# Patient Record
Sex: Female | Born: 1960 | Race: White | Hispanic: No | Marital: Married | Smoking: Never smoker
Health system: Southern US, Community
[De-identification: ages and names within clinical notes are randomized; demographics above are authoritative.]

## PROBLEM LIST (undated history)

## (undated) DIAGNOSIS — C801 Malignant (primary) neoplasm, unspecified: Secondary | ICD-10-CM

## (undated) HISTORY — PX: APPENDECTOMY: SHX54

## (undated) HISTORY — DX: Malignant (primary) neoplasm, unspecified: C80.1

---

## 1997-10-13 ENCOUNTER — Other Ambulatory Visit: Admission: RE | Admit: 1997-10-13 | Discharge: 1997-10-13 | Payer: Self-pay | Admitting: Obstetrics and Gynecology

## 1997-12-28 ENCOUNTER — Other Ambulatory Visit: Admission: RE | Admit: 1997-12-28 | Discharge: 1997-12-28 | Payer: Self-pay | Admitting: Obstetrics and Gynecology

## 1998-11-11 ENCOUNTER — Other Ambulatory Visit: Admission: RE | Admit: 1998-11-11 | Discharge: 1998-11-11 | Payer: Self-pay | Admitting: Obstetrics and Gynecology

## 2000-01-19 ENCOUNTER — Other Ambulatory Visit: Admission: RE | Admit: 2000-01-19 | Discharge: 2000-01-19 | Payer: Self-pay | Admitting: Obstetrics and Gynecology

## 2001-02-17 ENCOUNTER — Other Ambulatory Visit: Admission: RE | Admit: 2001-02-17 | Discharge: 2001-02-17 | Payer: Self-pay | Admitting: Obstetrics and Gynecology

## 2002-03-20 ENCOUNTER — Other Ambulatory Visit: Admission: RE | Admit: 2002-03-20 | Discharge: 2002-03-20 | Payer: Self-pay | Admitting: Obstetrics and Gynecology

## 2003-05-25 ENCOUNTER — Other Ambulatory Visit: Admission: RE | Admit: 2003-05-25 | Discharge: 2003-05-25 | Payer: Self-pay | Admitting: Obstetrics and Gynecology

## 2004-06-07 ENCOUNTER — Other Ambulatory Visit: Admission: RE | Admit: 2004-06-07 | Discharge: 2004-06-07 | Payer: Self-pay | Admitting: Obstetrics and Gynecology

## 2007-03-21 ENCOUNTER — Encounter: Admission: RE | Admit: 2007-03-21 | Discharge: 2007-03-21 | Payer: Self-pay | Admitting: Obstetrics and Gynecology

## 2010-01-05 ENCOUNTER — Encounter (INDEPENDENT_AMBULATORY_CARE_PROVIDER_SITE_OTHER): Payer: Self-pay | Admitting: *Deleted

## 2010-01-19 ENCOUNTER — Encounter (INDEPENDENT_AMBULATORY_CARE_PROVIDER_SITE_OTHER): Payer: Self-pay | Admitting: *Deleted

## 2010-01-20 ENCOUNTER — Ambulatory Visit: Payer: Self-pay | Admitting: Internal Medicine

## 2010-02-16 ENCOUNTER — Telehealth: Payer: Self-pay | Admitting: Internal Medicine

## 2010-02-17 ENCOUNTER — Encounter: Payer: Self-pay | Admitting: Internal Medicine

## 2010-02-17 ENCOUNTER — Ambulatory Visit
Admission: RE | Admit: 2010-02-17 | Discharge: 2010-02-17 | Payer: Self-pay | Source: Home / Self Care | Attending: Internal Medicine | Admitting: Internal Medicine

## 2010-02-26 ENCOUNTER — Encounter: Payer: Self-pay | Admitting: Obstetrics and Gynecology

## 2010-03-07 NOTE — Letter (Signed)
Summary: Pre Visit Letter Revised  [facility]  [address] [place], [place] [address]   Phone: [phone]  Fax: [phone]        01/05/2010 MRN: [id number] [facility] [address] [place], [place]  [address]             Procedure Date:  02/09/2010   Welcome to the Gastroenterology Division at [facility].    You are scheduled to see a nurse for your pre-procedure visit on 01/20/2010 at 4:00 on the 3rd floor at [facility], [address]  We ask that you try to arrive at our office 15 minutes prior to your appointment time to allow for check-in.  Please take a minute to review the attached form.  If you answer "Yes" to one or more of the questions on the first page, we ask that you call the person listed at your earliest opportunity.  If you answer "No" to all of the questions, please complete the rest of the form and bring it to your appointment.    Your nurse visit will consist of discussing your medical and surgical history, your immediate family medical history, and your medications.   If you are unable to list all of your medications on the form, please bring the medication bottles to your appointment and we will list them.  We will need to be aware of both prescribed and over the counter drugs.  We will need to know exact dosage information as well.    Please be prepared to read and sign documents such as consent forms, a financial agreement, and acknowledgement forms.  If necessary, and with your consent, a friend or relative is welcome to sit-in on the nurse visit with you.  Please bring your insurance card so that we may make a copy of it.  If your insurance requires a referral to see a specialist, please bring your referral form from your primary care physician.  No co-pay is required for this nurse visit.     If you cannot keep your appointment, please call [phone] to cancel or reschedule prior to your appointment date.   This allows [place] the opportunity to schedule an appointment for another patient in need of care.    Thank you for choosing [facility] for your medical needs.  We appreciate the opportunity to care for you.  Please visit [place] at our website  to learn more about our practice.  Sincerely, The Gastroenterology Division

## 2010-03-09 NOTE — Letter (Signed)
Summary: Moviprep Instructions  [facility]  [address]   [place], [place] [address]   Phone: [phone]  Fax: [phone]       April Gutierrez    1960-10-22    MRN: [id number]        Procedure Day Dorna Bloom: Thursday, 02-09-10     Arrival Time: 2:00 p.m.     Procedure Time: 3:00 p.m.     Location of Procedure:                    x   [facility] (4th Floor)   PREPARATION FOR COLONOSCOPY WITH MOVIPREP   Starting 5 days prior to your procedure 02-04-10 do not eat nuts, seeds, popcorn, corn, beans, peas,  salads, or any raw vegetables.  Do not take any fiber supplements (e.g. Metamucil, Citrucel, and Benefiber).  THE DAY BEFORE YOUR PROCEDURE         DATE: 02-08-10   DAY: Wednesday  1.  Drink clear liquids the entire day-NO SOLID FOOD  2.  Do not drink anything colored red or purple.  Avoid juices with pulp.  No orange juice.  3.  Drink at least 64 oz. (8 glasses) of fluid/clear liquids during the day to prevent dehydration and help the prep work efficiently.  CLEAR LIQUIDS INCLUDE: Water Jello Ice Popsicles Tea (sugar ok, no milk/cream) Powdered fruit flavored drinks Coffee (sugar ok, no milk/cream) Gatorade Juice: apple, white grape, white cranberry  Lemonade Clear bullion, consomm, broth Carbonated beverages (any kind) Strained chicken noodle soup Hard Candy                             4.  In the morning, mix first dose of MoviPrep solution:    Empty 1 Pouch A and 1 Pouch B into the disposable container    Add lukewarm drinking water to the top line of the container. Mix to dissolve    Refrigerate (mixed solution should be used within 24 hrs)  5.  Begin drinking the prep at 5:00 p.m. The MoviPrep container is divided by 4 marks.   Every 15 minutes drink the solution down to the next mark (approximately 8 oz) until the full liter is complete.   6.  Follow completed prep with 16 oz of clear liquid of your choice (Nothing red or  purple).  Continue to drink clear liquids until bedtime.  7.  Before going to bed, mix second dose of MoviPrep solution:    Empty 1 Pouch A and 1 Pouch B into the disposable container    Add lukewarm drinking water to the top line of the container. Mix to dissolve    Refrigerate  THE DAY OF YOUR PROCEDURE      DATE: 02-09-10  DAY: Thursday  Beginning at 10:00 a.m. (5 hours before procedure):         1. Every 15 minutes, drink the solution down to the next mark (approx 8 oz) until the full liter is complete.  2. Follow completed prep with 16 oz. of clear liquid of your choice.    3. You may drink clear liquids until  1:00 p.m.  (2 HOURS BEFORE PROCEDURE).   MEDICATION INSTRUCTIONS  Unless otherwise instructed, you should take regular prescription medications with a small sip of water   as early as possible the morning of your procedure.           OTHER INSTRUCTIONS  You will  need a responsible adult at least 52 years of age to accompany you and drive you home.   This person must remain in the waiting room during your procedure.  Wear loose fitting clothing that is easily removed.  Leave jewelry and other valuables at home.  However, you may wish to bring a book to read or  an iPod/MP3 player to listen to music as you wait for your procedure to start.  Remove all body piercing jewelry and leave at home.  Total time from sign-in until discharge is approximately 2-3 hours.  You should go home directly after your procedure and rest.  You can resume normal activities the  day after your procedure.  The day of your procedure you should not:   Drive   Make legal decisions   Operate machinery   Drink alcohol   Return to work  You will receive specific instructions about eating, activities and medications before you leave.    The above instructions have been reviewed and explained to me by   Ezra Sites RN  January 20, 2010 4:30 PM    I fully understand and  can verbalize these instructions _____________________________ Date _________

## 2010-03-09 NOTE — Procedures (Signed)
Summary: Colonoscopy  Patient: April Gutierrez Note: All result statuses are Final unless otherwise noted.  Tests: (1) Colonoscopy (COL)   COL Colonoscopy           DONE     [facility]     [address]     [place], [place]  [address]           COLONOSCOPY PROCEDURE REPORT           PATIENT:  April Gutierrez, April Gutierrez  MR#:  [id number]     BIRTHDATE:  Sep 11, 1960, 49 yrs. old  GENDER:  female     ENDOSCOPIST:  Hedwig Morton. Juanda Chance, MD     REF. BY:  Harold Hedge, M.D.     PROCEDURE DATE:  02/17/2010     PROCEDURE:  Colonoscopy 19147     ASA CLASS:  Class I     INDICATIONS:  Routine Risk Screening     MEDICATIONS:   Versed 8 mg, Fentanyl 75 mcg           DESCRIPTION OF PROCEDURE:   After the risks benefits and     alternatives of the procedure were thoroughly explained, informed     consent was obtained.  Digital rectal exam was performed and     revealed no rectal masses.   The [id number] endoscope was     introduced through the anus and advanced to the cecum, which was     identified by both the appendix and ileocecal valve, without     limitations.  The quality of the prep was good, using MoviPrep.     The instrument was then slowly withdrawn as the colon was fully     examined.     <<PROCEDUREIMAGES>>           FINDINGS:  No polyps or cancers were seen (see image1 and image2).     Retroflexed views in the rectum revealed no abnormalities.    The     scope was then withdrawn from the patient and the procedure     completed.           COMPLICATIONS:  None     ENDOSCOPIC IMPRESSION:     1) No polyps or cancers     2) Normal colonoscopy     RECOMMENDATIONS:     1) high fiber diet     REPEAT EXAM:  In 10 year(s) for.           ______________________________     Hedwig Morton. Juanda Chance, MD           CC:  Harold Hedge, M.D.           n.     eSIGNED:   Hedwig Morton. Maye Parkinson at 02/17/2010 08:33 AM           Frankey Shown, [id number]  Note: An exclamation mark (!) indicates a result  that was not dispersed into the flowsheet. Document Creation Date: 02/17/2010 8:33 AM _______________________________________________________________________  (1) Order result status: Final Collection or observation date-time: 02/17/2010 08:25 Requested date-time:  Receipt date-time:  Reported date-time:  Referring Physician:   Ordering Physician: Lina Sar [phone]) Specimen Source:  Source: Launa Grill Order Number: [id number] Lab site:   Appended Document: Colonoscopy    Clinical Lists Changes  Observations: Added new observation of COLONNXTDUE: 02/2020 (02/17/2010 14:21)

## 2010-03-09 NOTE — Miscellaneous (Signed)
Summary: [facility]  Clinical Lists Changes  Medications: Added new medication of MOVIPREP 100 GM  SOLR (PEG-KCL-NACL-NASULF-NA ASC-C) As per prep instructions. - Signed Rx of MOVIPREP 100 GM  SOLR (PEG-KCL-NACL-NASULF-NA ASC-C) As per prep instructions.;  #1 x 0;  Signed;  Entered by: Ezra Sites RN;  Authorized by: Hart Carwin MD;  Method used: Electronically to [facility] # [phone]*, [address] [place], [place]  [address], Ph: [phone], Fax: [phone] Observations: Added new observation of NKA: T (01/20/2010 15:57)    Prescriptions: MOVIPREP 100 GM  SOLR (PEG-KCL-NACL-NASULF-NA ASC-C) As per prep instructions.  #1 x 0   Entered by:   Ezra Sites RN   Authorized by:   Hart Carwin MD   Signed by:   Ezra Sites RN on 01/20/2010   Method used:   Electronically to        [facility] # [phone]* (retail)       [address]       [place], [place]  [address]       Ph: [phone]       Fax: [phone]   RxID:   [id number]

## 2010-03-09 NOTE — Progress Notes (Signed)
Summary: prep questions  Phone Note Call from Patient Call back at Home Phone [phone]   Caller: Patient Call For: Dr Juanda Chance Reason for Call: Talk to Nurse Summary of Call: Patient wants to speak to nurse regarding prep instructions Initial call taken by: Tawni Levy,  February 16, 2010 11:00 AM  Follow-up for Phone Call        This patient called and wanted to take the moviprep with gaterade.  I explained that the moviprep just needed regular water to be effective.  She also stated that her friends told hetr that the taste was bad.  I tried to reassure her that most people do fine with this prep, and that most patients get the best clean out from it.  She said ok, and that she would be here tomorrow. Follow-up by: Clide Cliff RN,  February 16, 2010 11:07 AM

## 2010-05-08 ENCOUNTER — Other Ambulatory Visit: Payer: Self-pay | Admitting: Obstetrics and Gynecology

## 2010-05-08 DIAGNOSIS — R928 Other abnormal and inconclusive findings on diagnostic imaging of breast: Secondary | ICD-10-CM

## 2010-05-09 ENCOUNTER — Ambulatory Visit
Admission: RE | Admit: 2010-05-09 | Discharge: 2010-05-09 | Disposition: A | Discharge status: Home / Self Care | Payer: BC Managed Care – PPO | Source: Ambulatory Visit | Attending: Obstetrics and Gynecology | Admitting: Obstetrics and Gynecology

## 2010-05-09 DIAGNOSIS — R928 Other abnormal and inconclusive findings on diagnostic imaging of breast: Secondary | ICD-10-CM

## 2011-04-26 ENCOUNTER — Other Ambulatory Visit: Payer: Self-pay | Admitting: Obstetrics and Gynecology

## 2011-04-26 DIAGNOSIS — Z1231 Encounter for screening mammogram for malignant neoplasm of breast: Secondary | ICD-10-CM

## 2011-05-03 ENCOUNTER — Ambulatory Visit
Admission: RE | Admit: 2011-05-03 | Discharge: 2011-05-03 | Disposition: A | Discharge status: Home / Self Care | Payer: BC Managed Care – PPO | Source: Ambulatory Visit | Attending: Obstetrics and Gynecology | Admitting: Obstetrics and Gynecology

## 2011-05-03 DIAGNOSIS — Z1231 Encounter for screening mammogram for malignant neoplasm of breast: Secondary | ICD-10-CM

## 2012-06-27 ENCOUNTER — Encounter: Payer: Self-pay | Admitting: Obstetrics and Gynecology

## 2012-06-27 ENCOUNTER — Ambulatory Visit: Payer: Self-pay | Admitting: Obstetrics and Gynecology

## 2012-06-27 DIAGNOSIS — Z01419 Encounter for gynecological examination (general) (routine) without abnormal findings: Secondary | ICD-10-CM

## 2012-08-05 ENCOUNTER — Ambulatory Visit (INDEPENDENT_AMBULATORY_CARE_PROVIDER_SITE_OTHER): Payer: BC Managed Care – PPO | Admitting: Obstetrics and Gynecology

## 2012-08-05 ENCOUNTER — Encounter: Payer: Self-pay | Admitting: Obstetrics and Gynecology

## 2012-08-05 ENCOUNTER — Other Ambulatory Visit: Payer: Self-pay

## 2012-08-05 VITALS — BP 120/68 | HR 80 | Resp 16 | Ht 64.5 in | Wt 138.0 lb

## 2012-08-05 DIAGNOSIS — Z0000 Encounter for general adult medical examination without abnormal findings: Secondary | ICD-10-CM

## 2012-08-05 DIAGNOSIS — Z01419 Encounter for gynecological examination (general) (routine) without abnormal findings: Secondary | ICD-10-CM

## 2012-08-05 DIAGNOSIS — Z1231 Encounter for screening mammogram for malignant neoplasm of breast: Secondary | ICD-10-CM

## 2012-08-05 LAB — CBC
Hemoglobin: 13.9 g/dL (ref 12.0–15.0)
MCH: 32.2 pg (ref 26.0–34.0)
MCHC: 33.3 g/dL (ref 30.0–36.0)
Platelets: 259 10*3/uL (ref 150–400)

## 2012-08-05 LAB — POCT URINALYSIS DIPSTICK

## 2012-08-05 LAB — LIPID PANEL
Cholesterol: 224 mg/dL — ABNORMAL HIGH (ref 0–200)
Total CHOL/HDL Ratio: 2.2 Ratio
Triglycerides: 37 mg/dL (ref <150)
VLDL: 7 mg/dL (ref 0–40)

## 2012-08-05 MED ORDER — ESTRADIOL-NORETHINDRONE ACET 0.05-0.25 MG/DAY TD PTTW: 1.0000 | MEDICATED_PATCH | TRANSDERMAL | Status: DC

## 2012-08-05 NOTE — Patient Instructions (Signed)

## 2012-08-05 NOTE — Progress Notes (Signed)
52 y.o.   Married    Caucasian   female   G3P2011   here for annual exam.  Likes her HRT.  No vasomotor sx.  Wants to continue.  No vag bleeding.    No LMP recorded. Patient is postmenopausal.          Sexually active: yes  The current method of family planning is post menopausal status.    Exercising: workout group 2x/wk , tennis 3-4x/wk ,walk, run almost everyday. Last mammogram:  05/07/11; Scheduled 08/25/12 Last pap smear: 2012 History of abnormal pap: no Smoking: no Alcohol: 2-3 glasses of wine/ wk Last colonoscopy: 2011 Last Bone Density:  no Last tetanus shot: 06/27/11 Last cholesterol check: couple of years  Hgb: PCP               Urine: Normal   Family History  Problem Relation Age of Onset  . Osteoporosis Mother   . Hypertension Father   . Osteoporosis Maternal Grandmother     There are no active problems to display for this patient.   No past medical history on file.  Past Surgical History  Procedure Laterality Date  . Appendectomy      Allergies: Review of patient's allergies indicates no known allergies.  Current Outpatient Prescriptions  Medication Sig Dispense Refill  . CALCIUM PO Take by mouth. occ      . estradiol-norethindrone (COMBIPATCH) 0.05-0.25 MG/DAY Place 1 patch onto the skin 2 (two) times a week.       No current facility-administered medications for this visit.    ROS: Pertinent items are noted in HPI.  Social Hx: married one child, stays at home  Exam:    BP 120/68  Pulse 80  Resp 16  Ht 5' 4.5" (1.638 m)  Wt 138 lb (62.596 kg)  BMI 23.33 kg/m2   Wt Readings from Last 3 Encounters:  08/05/12 138 lb (62.596 kg)     Ht Readings from Last 3 Encounters:  08/05/12 5' 4.5" (1.638 m)    General appearance: alert, cooperative and appears stated age Head: Normocephalic, without obvious abnormality, atraumatic Neck: no adenopathy, supple, symmetrical, trachea midline and thyroid not enlarged, symmetric, no  tenderness/mass/nodules Lungs: clear to auscultation bilaterally Breasts: Inspection negative, No nipple retraction or dimpling, No nipple discharge or bleeding, No axillary or supraclavicular adenopathy, Normal to palpation without dominant masses Heart: regular rate and rhythm Abdomen: soft, non-tender; bowel sounds normal; no masses,  no organomegaly Extremities: extremities normal, atraumatic, no cyanosis or edema Skin: Skin color, texture, turgor normal. No rashes or lesions Lymph nodes: Cervical, supraclavicular, and axillary nodes normal. No abnormal inguinal nodes palpated Neurologic: Grossly normal   Pelvic: External genitalia:  no lesions              Urethra:  normal appearing urethra with no masses, tenderness or lesions              Bartholins and Skenes: normal                 Vagina: normal appearing vagina with normal color and discharge, no lesions              Cervix: normal appearance              Pap taken: no        Bimanual Exam:  Uterus:  uterus is normal size, shape, consistency and nontender, ant, mobile  Adnexa: normal adnexa in size, nontender and no masses                                      Rectovaginal: Confirms                                      Anus:  normal sphincter tone, no lesions  A: normal menopausal exam, on HRT     P: mammogram counseled on breast self exam, mammography screening, adequate intake of calcium and vitamin D, diet and exercise return annually or prn   RF Combipatch for 1 year.    An After Visit Summary was printed and given to the patient.

## 2012-08-07 NOTE — Progress Notes (Signed)
Pt notified of lab results

## 2012-08-25 ENCOUNTER — Ambulatory Visit
Admission: RE | Admit: 2012-08-25 | Discharge: 2012-08-25 | Disposition: A | Discharge status: Home / Self Care | Payer: BC Managed Care – PPO | Source: Ambulatory Visit

## 2012-08-25 DIAGNOSIS — Z1231 Encounter for screening mammogram for malignant neoplasm of breast: Secondary | ICD-10-CM

## 2013-06-30 ENCOUNTER — Telehealth: Payer: Self-pay | Admitting: Obstetrics and Gynecology

## 2013-06-30 MED ORDER — ESTRADIOL-NORETHINDRONE ACET 0.05-0.25 MG/DAY TD PTTW
1.0000 | MEDICATED_PATCH | TRANSDERMAL | Status: DC
Start: 2013-06-30 — End: 2013-08-13

## 2013-06-30 NOTE — Telephone Encounter (Signed)
Spoke with patient. Patient states that she was due to replace patch yesterday but forgot them at home. Advised we could call in extra patch to local pharmacy or she could wait to put a new patch on when she gets home but she may experience some bleeding and menopausal symptoms. Patient would like RX for combipatch sent over the [facility] in [place]. Patient aware that she may be required to pay out of pocket for extra patches. Rx sent for two patches to [facility] in [place]. Patient agreeable and verbalizes understanding.  Routing to Dr.Lathrop as covering CC: Dr.Silva  Routing to provider for final review. Patient agreeable to disposition. Will close encounter

## 2013-06-30 NOTE — Telephone Encounter (Signed)
Patient forgot her RX for combipatch. [place] at the beach and due to return Sunday, 07/05/13. She wants to speak with the nurse about whether she can wait to take RX until she gets home?  [place]  [phone]

## 2013-07-01 ENCOUNTER — Telehealth: Payer: Self-pay | Admitting: Gynecology

## 2013-07-01 NOTE — Telephone Encounter (Signed)
Called to Pharmacist Shanon Brow, advised okay for 28 day supply if patient agreeable as boxes come with 8 only and cannot be broken. May have to pay out of pocket if has recent refill but patient out of town. They will request Vacation override and contact patient.   Routing to provider for final review. Patient agreeable to disposition. Will close encounter

## 2013-07-01 NOTE — Telephone Encounter (Signed)
Pharmacy is calling they have a question about rx estradiol-norethindrone [facility]) 0.05-0.25 MG/DAY  Place 1 patch onto the skin 2 (two) times a week., Starting 06/30/2013, Until Discontinued, Normal, Last Dose: Not Recorded  Refills: 0 ordered Pharmacy: [id number] - [place], [place] [address] [place] [address] [place] [address] Pharmacy can not break box open wants to know if it is okay to give a whole 28 days supply.

## 2013-07-03 ENCOUNTER — Encounter: Payer: Self-pay | Admitting: Obstetrics and Gynecology

## 2013-08-06 ENCOUNTER — Ambulatory Visit: Payer: BC Managed Care – PPO | Admitting: Obstetrics and Gynecology

## 2013-08-10 ENCOUNTER — Telehealth: Payer: Self-pay | Admitting: Obstetrics and Gynecology

## 2013-08-10 NOTE — Telephone Encounter (Signed)
Verifying pts appt

## 2013-08-11 NOTE — Telephone Encounter (Signed)
patient confirmed.

## 2013-08-13 ENCOUNTER — Other Ambulatory Visit: Payer: Self-pay

## 2013-08-13 ENCOUNTER — Encounter: Payer: Self-pay | Admitting: Obstetrics and Gynecology

## 2013-08-13 ENCOUNTER — Ambulatory Visit (INDEPENDENT_AMBULATORY_CARE_PROVIDER_SITE_OTHER): Payer: BC Managed Care – PPO | Admitting: Obstetrics and Gynecology

## 2013-08-13 VITALS — BP 136/76 | HR 60 | Resp 18 | Ht 64.75 in | Wt 138.0 lb

## 2013-08-13 DIAGNOSIS — Z1231 Encounter for screening mammogram for malignant neoplasm of breast: Secondary | ICD-10-CM

## 2013-08-13 DIAGNOSIS — R3129 Other microscopic hematuria: Secondary | ICD-10-CM

## 2013-08-13 DIAGNOSIS — Z0000 Encounter for general adult medical examination without abnormal findings: Secondary | ICD-10-CM

## 2013-08-13 DIAGNOSIS — Z01419 Encounter for gynecological examination (general) (routine) without abnormal findings: Secondary | ICD-10-CM

## 2013-08-13 LAB — POCT URINALYSIS DIPSTICK
BILIRUBIN UA: NEGATIVE
GLUCOSE UA: NEGATIVE
Ketones, UA: NEGATIVE
Leukocytes, UA: NEGATIVE
NITRITE UA: NEGATIVE
Protein, UA: NEGATIVE
UROBILINOGEN UA: NEGATIVE
pH, UA: 5

## 2013-08-13 LAB — HEMOGLOBIN, FINGERSTICK: HEMOGLOBIN, FINGERSTICK: 13.7 g/dL (ref 12.0–16.0)

## 2013-08-13 MED ORDER — ESTRADIOL-NORETHINDRONE ACET 0.05-0.25 MG/DAY TD PTTW: 1.0000 | MEDICATED_PATCH | TRANSDERMAL | Status: DC

## 2013-08-13 NOTE — Patient Instructions (Signed)

## 2013-08-13 NOTE — Progress Notes (Signed)
GYNECOLOGY VISIT  PCP:   Referring provider:   HPI: 53 y.o.   Married  Caucasian  female   [phone] with No LMP recorded. Patient is postmenopausal.   here for   Annual Gynecological Examination Using the Combipatch for HRT.  Some difficulty finding this.  Likes to use the patch.  No menopausal symptoms.   Will do full labs next year.   Hgb:  13.9 Urine:  RBC=Small - asymptomatic.   GYNECOLOGIC HISTORY: No LMP recorded. Patient is postmenopausal. Sexually active:  Yes Partner preference: Female Contraception:   Post menopausal Menopausal hormone therapy: Combipatch DES exposure: No Blood transfusions:  No  Sexually transmitted diseases:   No GYN procedures and prior surgeries:  N/A Last mammogram:  08/27/12 BIRADS1: Neg               Last pap and high risk HPV testing: ?   History of abnormal pap smear:  No   OB History   Grav Para Term Preterm Abortions TAB SAB Ect Mult Living   3 2 2  1     1        LIFESTYLE: Exercise: yes,   Walking daily, running, tennis 3- 4 x weekly             Tobacco: No Alcohol: No Drug use:  No  OTHER HEALTH MAINTENANCE: Tetanus/TDap: 06/2011 Gardisil: No Influenza:  No Zostavax: No  Bone density: ? Colonoscopy: 2012 Normal - Every 10 years   Cholesterol check: 08/2012 - normal ratios.  Family History  Problem Relation Age of Onset  . Osteoporosis Mother   . Hypertension Father   . Osteoporosis Maternal Grandmother     There are no active problems to display for this patient.  No past medical history on file.  Past Surgical History  Procedure Laterality Date  . Appendectomy      ALLERGIES: Review of patient's allergies indicates no known allergies.  Current Outpatient Prescriptions  Medication Sig Dispense Refill  . CALCIUM PO Take by mouth. occ      . estradiol-norethindrone (COMBIPATCH) 0.05-0.25 MG/DAY Place 1 patch onto the skin 2 (two) times a week.  2 patch  0   No current facility-administered medications for  this visit.     ROS:  Pertinent items are noted in HPI.  SOCIAL HISTORY:  Housewife.   PHYSICAL EXAMINATION:    BP 136/76  Pulse 60  Resp 18  Ht 5' 4.75" (1.645 m)  Wt 138 lb (62.596 kg)  BMI 23.13 kg/m2   Wt Readings from Last 3 Encounters:  08/13/13 138 lb (62.596 kg)  08/05/12 138 lb (62.596 kg)     Ht Readings from Last 3 Encounters:  08/13/13 5' 4.75" (1.645 m)  08/05/12 5' 4.5" (1.638 m)    General appearance: alert, cooperative and appears stated age Head: Normocephalic, without obvious abnormality, atraumatic Neck: no adenopathy, supple, symmetrical, trachea midline and thyroid not enlarged, symmetric, no tenderness/mass/nodules Lungs: clear to auscultation bilaterally Breasts: multiple areas of skin erythema (insect bites) of the right lateral breast, No nipple retraction or dimpling, No nipple discharge or bleeding, No axillary or supraclavicular adenopathy, Normal to palpation without dominant masses Heart: regular rate and rhythm Abdomen: soft, non-tender; no masses,  no organomegaly Extremities: extremities normal, atraumatic, no cyanosis or edema Skin: Skin color, texture, turgor normal. No rashes or lesions Lymph nodes: Cervical, supraclavicular, and axillary nodes normal. No abnormal inguinal nodes palpated Neurologic: Grossly normal  Pelvic: External genitalia:  no lesions  Urethra:  normal appearing urethra with no masses, tenderness or lesions              Bartholins and Skenes: normal                 Vagina: normal appearing vagina with normal color and discharge, no lesions              Cervix: normal appearance              Pap and high risk HPV testing done: Yes.  .            Bimanual Exam:  Uterus:  uterus is normal size, shape, consistency and nontender                                      Adnexa: normal adnexa in size, nontender and no masses                                      Rectovaginal: Confirms                                       Anus:  normal sphincter tone, no lesions  ASSESSMENT  Normal gynecologic exam. HRT patient.  Microscopic hematuria.  Recent insect bite.   PLAN  Mammogram recommended yearly. Patient will schedule at [facility]. Pap smear and high risk HPV testing done.  Counseled on self breast exam, Calcium and vitamin D intake, exercise. Full labs next year. Urine micro and culture.  Continue with Combipatch twice weekly.  Discussed risks of DVT, PE, MI, stroke, breast cancer.  hydrocortisone to skin on breast.  To PCP or urgent care if develops spreading rash.  Return annually or prn   An After Visit Summary was printed and given to the patient.

## 2013-08-14 LAB — CULTURE, URINE COMPREHENSIVE
COLONY COUNT: NO GROWTH
ORGANISM ID, BACTERIA: NO GROWTH

## 2013-08-14 LAB — URINALYSIS, MICROSCOPIC ONLY
Bacteria, UA: NONE SEEN
CASTS: NONE SEEN
Crystals: NONE SEEN
SQUAMOUS EPITHELIAL / LPF: NONE SEEN

## 2013-08-17 LAB — IPS PAP TEST WITH HPV

## 2013-09-03 ENCOUNTER — Ambulatory Visit
Admission: RE | Admit: 2013-09-03 | Discharge: 2013-09-03 | Disposition: A | Discharge status: Home / Self Care | Payer: BC Managed Care – PPO | Source: Ambulatory Visit

## 2013-09-03 DIAGNOSIS — Z1231 Encounter for screening mammogram for malignant neoplasm of breast: Secondary | ICD-10-CM

## 2013-12-07 ENCOUNTER — Encounter: Payer: Self-pay | Admitting: Obstetrics and Gynecology

## 2014-08-23 ENCOUNTER — Other Ambulatory Visit: Payer: Self-pay | Admitting: *Deleted

## 2014-08-23 MED ORDER — ESTRADIOL-NORETHINDRONE ACET 0.05-0.25 MG/DAY TD PTTW: 1.0000 | MEDICATED_PATCH | TRANSDERMAL | Status: DC

## 2014-08-23 NOTE — Telephone Encounter (Signed)
Medication refill request: Comipatch .05mg /.25mg  Last AEX:  08-13-13  Next AEX: 08-25-14 Last MMG (if hormonal medication request): 09-04-13 WNL Refill authorized: please advise

## 2014-08-25 ENCOUNTER — Ambulatory Visit: Payer: BC Managed Care – PPO | Admitting: Obstetrics and Gynecology

## 2014-09-08 ENCOUNTER — Encounter: Payer: Self-pay | Admitting: Obstetrics and Gynecology

## 2014-09-08 ENCOUNTER — Ambulatory Visit (INDEPENDENT_AMBULATORY_CARE_PROVIDER_SITE_OTHER): Payer: BLUE CROSS/BLUE SHIELD | Admitting: Obstetrics and Gynecology

## 2014-09-08 ENCOUNTER — Other Ambulatory Visit: Payer: Self-pay

## 2014-09-08 VITALS — BP 130/80 | HR 60 | Resp 20 | Ht 64.25 in | Wt 141.2 lb

## 2014-09-08 DIAGNOSIS — Z01419 Encounter for gynecological examination (general) (routine) without abnormal findings: Secondary | ICD-10-CM

## 2014-09-08 DIAGNOSIS — R319 Hematuria, unspecified: Secondary | ICD-10-CM

## 2014-09-08 DIAGNOSIS — Z0000 Encounter for general adult medical examination without abnormal findings: Secondary | ICD-10-CM

## 2014-09-08 DIAGNOSIS — Z1231 Encounter for screening mammogram for malignant neoplasm of breast: Secondary | ICD-10-CM

## 2014-09-08 LAB — POCT URINALYSIS DIPSTICK
BILIRUBIN UA: NEGATIVE
Glucose, UA: NEGATIVE
Ketones, UA: NEGATIVE
Leukocytes, UA: NEGATIVE
NITRITE UA: NEGATIVE
PROTEIN UA: NEGATIVE
Urobilinogen, UA: NEGATIVE
pH, UA: 5

## 2014-09-08 MED ORDER — ESTRADIOL-NORETHINDRONE ACET 0.05-0.25 MG/DAY TD PTTW: 1.0000 | MEDICATED_PATCH | TRANSDERMAL | Status: DC

## 2014-09-08 NOTE — Progress Notes (Signed)
Patient ID: April Gutierrez, female   DOB: 05/01/1960, 54 y.o.   MRN: [id number] 54 y.o. G39P2011 Married Caucasian female here for annual exam.   Patient is on [place].  Hot flashes are controlled.  Sleeping OK but not as well as she used to.  No vaginal bleeding.   Bought a home on [address].  Going to have a month off in October with her husband.  PCP:   None  Patient's last menstrual period was 02/05/2009 (approximate).          Sexually active: Yes.   female The current method of family planning is post menopausal status.    Exercising: Yes.    tennis, running, walking weights and cardio. Smoker:  no  Health Maintenance: Pap:  08-13-13 Neg:Neg HR HPV History of abnormal Pap:  no MMG:  09-04-13 Density Cat.C/Neg:[facility]. Scheduled for September 2016.  Colonoscopy:  2011 normal with Dr. Delfin Edis.  Next due 2021. BMD:   n/a  Result  n/a TDaP:  06-27-11 Screening Labs:  Hb today: 13.6, Urine today: Trace RBCs - asymptomatic. Hx of hematuria.     reports that she has never smoked. She has never used smokeless tobacco. She reports that she drinks about 1.8 oz of alcohol per week. She reports that she does not use illicit drugs.  History reviewed. No pertinent past medical history.  Past Surgical History  Procedure Laterality Date  . Appendectomy      Current Outpatient Prescriptions  Medication Sig Dispense Refill  . CALCIUM PO Take by mouth. occ    . estradiol-norethindrone (COMBIPATCH) 0.05-0.25 MG/DAY Place 1 patch onto the skin 2 (two) times a week. 8 patch 0   No current facility-administered medications for this visit.    Family History  Problem Relation Age of Onset  . Osteoporosis Mother   . Hypertension Father   . Osteoporosis Maternal Grandmother     ROS:  Pertinent items are noted in HPI.  Otherwise, a comprehensive ROS was negative.  Exam:   BP 130/80 mmHg  Pulse 60  Resp 20  Ht 5' 4.25" (1.632 m)  Wt 141 lb 3.2 oz (64.048 kg)  BMI 24.05  kg/m2  LMP 02/05/2009 (Approximate)    General appearance: alert, cooperative and appears stated age Head: Normocephalic, without obvious abnormality, atraumatic Neck: no adenopathy, supple, symmetrical, trachea midline and thyroid normal to inspection and palpation Lungs: clear to auscultation bilaterally Breasts: normal appearance, no masses or tenderness, Inspection negative, No nipple retraction or dimpling, No nipple discharge or bleeding, No axillary or supraclavicular adenopathy Heart: regular rate and rhythm Abdomen: soft, non-tender; bowel sounds normal; no masses,  no organomegaly Extremities: extremities normal, atraumatic, no cyanosis or edema Skin: Skin color, texture, turgor normal. No rashes or lesions Lymph nodes: Cervical, supraclavicular, and axillary nodes normal. No abnormal inguinal nodes palpated Neurologic: Grossly normal  Pelvic: External genitalia:  no lesions              Urethra:  normal appearing urethra with no masses, tenderness or lesions              Bartholins and Skenes: normal                 Vagina: normal appearing vagina with normal color and discharge, no lesions              Cervix: no lesions              Pap taken: No. Bimanual Exam:  Uterus:  normal size, contour, position, consistency, mobility, non-tender              Adnexa: normal adnexa and no mass, fullness, tenderness              Rectovaginal: Yes.  .  Confirms.              Anus:  normal sphincter tone, no lesions  Chaperone was present for exam.  Assessment:   Well woman visit with normal exam. HRT patient.  Hx microscopic hematuria.   Plan: Yearly mammogram recommended after age 50.  Scheduled. Recommended self breast exam.  Pap and HR HPV as above. Discussed Calcium, Vitamin D, regular exercise program including cardiovascular and weight bearing exercise. Labs performed.  Yes.  .   See orders. Urine micro and culture sent.  Refills given on medications.  Yes.  .  See  orders.  Combipatch.  Discussed risks of DVT, PE, MI, stroke, breast cancer.  Follow up annually and prn.      After visit summary provided.

## 2014-09-08 NOTE — Patient Instructions (Signed)

## 2014-09-09 LAB — COMPREHENSIVE METABOLIC PANEL
ALT: 26 U/L (ref 6–29)
AST: 25 U/L (ref 10–35)
Albumin: 4.2 g/dL (ref 3.6–5.1)
Alkaline Phosphatase: 43 U/L (ref 33–130)
BUN: 16 mg/dL (ref 7–25)
CALCIUM: 9.4 mg/dL (ref 8.6–10.4)
CHLORIDE: 104 mmol/L (ref 98–110)
CO2: 21 mmol/L (ref 20–31)
Creat: 0.82 mg/dL (ref 0.50–1.05)
GLUCOSE: 68 mg/dL (ref 65–99)
POTASSIUM: 4 mmol/L (ref 3.5–5.3)
SODIUM: 139 mmol/L (ref 135–146)
TOTAL PROTEIN: 7.1 g/dL (ref 6.1–8.1)
Total Bilirubin: 0.6 mg/dL (ref 0.2–1.2)

## 2014-09-09 LAB — URINALYSIS, MICROSCOPIC ONLY
Bacteria, UA: NONE SEEN [HPF]
CRYSTALS: NONE SEEN [HPF]
Casts: NONE SEEN [LPF]
Yeast: NONE SEEN [HPF]

## 2014-09-09 LAB — CBC
HCT: 41 % (ref 36.0–46.0)
Hemoglobin: 13.7 g/dL (ref 12.0–15.0)
MCH: 33.7 pg (ref 26.0–34.0)
MCHC: 33.4 g/dL (ref 30.0–36.0)
MCV: 100.7 fL — AB (ref 78.0–100.0)
MPV: 9.7 fL (ref 8.6–12.4)
PLATELETS: 268 10*3/uL (ref 150–400)
RBC: 4.07 MIL/uL (ref 3.87–5.11)
RDW: 12.9 % (ref 11.5–15.5)
WBC: 7.1 10*3/uL (ref 4.0–10.5)

## 2014-09-09 LAB — LIPID PANEL
CHOL/HDL RATIO: 1.7 ratio (ref <=5.0)
CHOLESTEROL: 216 mg/dL — AB (ref 125–200)
HDL: 124 mg/dL (ref >=46)
LDL CALC: 82 mg/dL (ref <130)
Triglycerides: 48 mg/dL (ref <150)
VLDL: 10 mg/dL (ref <30)

## 2014-09-09 LAB — VITAMIN D 25 HYDROXY (VIT D DEFICIENCY, FRACTURES): Vit D, 25-Hydroxy: 39 ng/mL (ref 30–100)

## 2014-09-09 LAB — HEMOGLOBIN, FINGERSTICK: HEMOGLOBIN, FINGERSTICK: 13.6 g/dL (ref 12.0–16.0)

## 2014-09-09 LAB — TSH: TSH: 1.633 u[IU]/mL (ref 0.350–4.500)

## 2014-09-10 LAB — URINE CULTURE
COLONY COUNT: NO GROWTH
ORGANISM ID, BACTERIA: NO GROWTH

## 2014-09-24 ENCOUNTER — Other Ambulatory Visit: Payer: Self-pay | Admitting: Obstetrics and Gynecology

## 2014-10-18 ENCOUNTER — Ambulatory Visit
Admission: RE | Admit: 2014-10-18 | Discharge: 2014-10-18 | Disposition: A | Discharge status: Home / Self Care | Payer: BLUE CROSS/BLUE SHIELD | Source: Ambulatory Visit

## 2014-10-18 DIAGNOSIS — Z1231 Encounter for screening mammogram for malignant neoplasm of breast: Secondary | ICD-10-CM

## 2014-10-20 ENCOUNTER — Other Ambulatory Visit: Payer: Self-pay | Admitting: Obstetrics and Gynecology

## 2014-10-20 DIAGNOSIS — R928 Other abnormal and inconclusive findings on diagnostic imaging of breast: Secondary | ICD-10-CM

## 2014-10-25 ENCOUNTER — Ambulatory Visit
Admission: RE | Admit: 2014-10-25 | Discharge: 2014-10-25 | Disposition: A | Discharge status: Home / Self Care | Payer: BLUE CROSS/BLUE SHIELD | Source: Ambulatory Visit | Attending: Obstetrics and Gynecology | Admitting: Obstetrics and Gynecology

## 2014-10-25 DIAGNOSIS — R928 Other abnormal and inconclusive findings on diagnostic imaging of breast: Secondary | ICD-10-CM

## 2015-05-24 ENCOUNTER — Telehealth: Payer: Self-pay | Admitting: *Deleted

## 2015-05-24 NOTE — Telephone Encounter (Signed)
Question about combi patch refill.

## 2015-05-25 MED ORDER — ESTRADIOL 0.05 MG/24HR TD PTTW: 1.0000 | MEDICATED_PATCH | TRANSDERMAL | Status: DC

## 2015-05-25 MED ORDER — PROGESTERONE MICRONIZED 100 MG PO CAPS: 100.0000 mg | ORAL_CAPSULE | Freq: Every day | ORAL | Status: DC

## 2015-05-25 NOTE — Telephone Encounter (Signed)
April Gutierrez in regards to RX. I did send in the new RXs to her pharmacy. -eh

## 2015-05-25 NOTE — Telephone Encounter (Signed)
Ok for [employer] Dot 0.05 mg transdermal to skin twice weekly.  #8, RF 3. Prometrium 100 mg po q hs.  #30, RF 3.   Prometrium may help patient to sleep at night.   Please send to pharmacy of choice.   Thank you!

## 2015-05-25 NOTE — Telephone Encounter (Signed)
Called [facility] and the Combi patch is on back order. Can we change this RX to something else?

## 2015-05-30 NOTE — Telephone Encounter (Signed)
Patient is aware that we sent in the new RX. I explained that since the Combi patch was on back order she would have to take the estradiol and progesterone separately. Patient is going to try this and let us know if she has any problems. -eh

## 2015-09-09 ENCOUNTER — Ambulatory Visit: Payer: BLUE CROSS/BLUE SHIELD | Admitting: Obstetrics and Gynecology

## 2015-09-23 NOTE — Progress Notes (Signed)
55 y.o. G58P2011 Married Caucasian female here for annual exam.    Patient is on Vivelle Dot and Prometrium.  Controls the hot flashes well.  Liked the [employer] better.    Strong family history of osteoporosis.  Patient doing a lot of exercise.   PCP:   None.  Patient's last menstrual period was 02/05/2009 (approximate).           Sexually active: Yes.   female The current method of family planning is post menopausal status.    Exercising: Yes.    Tennis, weights and cardio Smoker:  no  Health Maintenance: Pap:  08-13-13 Neg:Neg HR HPV History of abnormal Pap:  no MMG:  10-18-14 Density C/Lt.Br.possible mass;Rt.Br.neg--3D Lt.Diag.and Lt.US:Lt.breast cyst,negative/BiRads2/screening 24yr:[facility] Colonoscopy:  2011 normal with Dr. Ulyses Southward due 2021. BMD:   n/a  Result  n/a TDaP:  06-27-11 Gardasil:   N/A HIV:  Today. Hep C:  Today. Screening Labs:  Urine today: Neg   reports that she has never smoked. She has never used smokeless tobacco. She reports that she drinks about 1.8 oz of alcohol per week . She reports that she does not use drugs.  History reviewed. No pertinent past medical history.  Past Surgical History:  Procedure Laterality Date  . APPENDECTOMY      Current Outpatient Prescriptions  Medication Sig Dispense Refill  . CALCIUM PO Take by mouth. occ    . estradiol (VIVELLE-DOT) 0.05 MG/24HR patch APPLY 1 PATCH(0.05 MG) EXTERNALLY TO THE SKIN 2 TIMES A WEEK 8 patch 0  . progesterone (PROMETRIUM) 100 MG capsule TAKE 1 CAPSULE(100 MG) BY MOUTH AT BEDTIME 30 capsule 0   No current facility-administered medications for this visit.     Family History  Problem Relation Age of Onset  . Osteoporosis Mother   . Hypertension Father   . Osteoporosis Maternal Grandmother     ROS:  Pertinent items are noted in HPI.  Otherwise, a comprehensive ROS was negative.  Exam:   BP 134/82 (BP Location: Right Arm, Patient Position: Sitting, Cuff Size: Normal)   Pulse  64   Resp 18   Ht 5' 4.25" (1.632 m)   Wt 139 lb (63 kg)   LMP 02/05/2009 (Approximate)   BMI 23.67 kg/m     General appearance: alert, cooperative and appears stated age Head: Normocephalic, without obvious abnormality, atraumatic Neck: no adenopathy, supple, symmetrical, trachea midline and thyroid normal to inspection and palpation Lungs: clear to auscultation bilaterally Breasts: normal appearance, no masses or tenderness, No nipple retraction or dimpling, No nipple discharge or bleeding, No axillary or supraclavicular adenopathy Heart: regular rate and rhythm Abdomen: soft, non-tender; no masses, no organomegaly Extremities: extremities normal, atraumatic, no cyanosis or edema Skin: Skin color, texture, turgor normal. No rashes or lesions Lymph nodes: Cervical, supraclavicular, and axillary nodes normal. No abnormal inguinal nodes palpated Neurologic: Grossly normal  Pelvic: External genitalia:  no lesions              Urethra:  normal appearing urethra with no masses, tenderness or lesions              Bartholins and Skenes: normal                 Vagina: normal appearing vagina with normal color and discharge, no lesions              Cervix: no lesions              Pap taken: No. Bimanual Exam:  Uterus:  normal size, contour, position, consistency, mobility, non-tender              Adnexa: no mass, fullness, tenderness              Rectal exam: Yes.  .  Confirms.              Anus:  normal sphincter tone, no lesions  Chaperone was present for exam.  Assessment:   Well woman visit with normal exam. FH osteoporosis.  HRT patient.   Plan: Yearly mammogram recommended after age 53.  Recommended self breast exam.  Pap and HR HPV as above. Discussed Calcium, Vitamin D, regular exercise program including cardiovascular and weight bearing exercise. Routine labs and HIV, Hep C. Continue Vivelle Dot 0.05 mg twice weekly and Prometrium 100 mg daily.   Discussed risks of DVT,  PE, MI, stroke, and breast cancer. Discussed benefits of osteoporosis prevention.   She will continue with HRT.    Follow up annually and prn.         After visit summary provided.

## 2015-09-26 ENCOUNTER — Other Ambulatory Visit: Payer: Self-pay | Admitting: Obstetrics and Gynecology

## 2015-09-26 ENCOUNTER — Ambulatory Visit: Payer: BLUE CROSS/BLUE SHIELD | Admitting: Obstetrics and Gynecology

## 2015-09-26 NOTE — Telephone Encounter (Signed)
Medication refill request: Vivelle patch/ Prometrium Last AEX:  09-08-14 Next AEX: 09-28-15 Last MMG (if hormonal medication request): 10-25-14 U/S WNL Refill authorized: please advise

## 2015-09-28 ENCOUNTER — Ambulatory Visit (INDEPENDENT_AMBULATORY_CARE_PROVIDER_SITE_OTHER): Payer: BLUE CROSS/BLUE SHIELD | Admitting: Obstetrics and Gynecology

## 2015-09-28 ENCOUNTER — Other Ambulatory Visit: Payer: Self-pay | Admitting: Obstetrics and Gynecology

## 2015-09-28 ENCOUNTER — Encounter: Payer: Self-pay | Admitting: Obstetrics and Gynecology

## 2015-09-28 VITALS — BP 134/82 | HR 64 | Resp 18 | Ht 64.25 in | Wt 139.0 lb

## 2015-09-28 DIAGNOSIS — Z01419 Encounter for gynecological examination (general) (routine) without abnormal findings: Secondary | ICD-10-CM | POA: Diagnosis not present

## 2015-09-28 DIAGNOSIS — Z113 Encounter for screening for infections with a predominantly sexual mode of transmission: Secondary | ICD-10-CM | POA: Diagnosis not present

## 2015-09-28 DIAGNOSIS — Z0000 Encounter for general adult medical examination without abnormal findings: Secondary | ICD-10-CM

## 2015-09-28 DIAGNOSIS — Z1231 Encounter for screening mammogram for malignant neoplasm of breast: Secondary | ICD-10-CM

## 2015-09-28 LAB — POCT URINALYSIS DIPSTICK
Bilirubin, UA: NEGATIVE
Blood, UA: NEGATIVE
GLUCOSE UA: NEGATIVE
KETONES UA: NEGATIVE
LEUKOCYTES UA: NEGATIVE
Nitrite, UA: NEGATIVE
PROTEIN UA: NEGATIVE
Urobilinogen, UA: NEGATIVE
pH, UA: 5

## 2015-09-28 LAB — LIPID PANEL
CHOL/HDL RATIO: 1.7 ratio (ref <=5.0)
CHOLESTEROL: 253 mg/dL — AB (ref 125–200)
HDL: 149 mg/dL (ref >=46)
LDL CALC: 94 mg/dL (ref <130)
Triglycerides: 50 mg/dL (ref <150)
VLDL: 10 mg/dL (ref <30)

## 2015-09-28 LAB — COMPREHENSIVE METABOLIC PANEL
ALT: 29 U/L (ref 6–29)
AST: 29 U/L (ref 10–35)
Albumin: 4.4 g/dL (ref 3.6–5.1)
Alkaline Phosphatase: 49 U/L (ref 33–130)
BUN: 20 mg/dL (ref 7–25)
CHLORIDE: 102 mmol/L (ref 98–110)
CO2: 24 mmol/L (ref 20–31)
CREATININE: 0.72 mg/dL (ref 0.50–1.05)
Calcium: 9.8 mg/dL (ref 8.6–10.4)
Glucose, Bld: 76 mg/dL (ref 65–99)
POTASSIUM: 4.2 mmol/L (ref 3.5–5.3)
SODIUM: 136 mmol/L (ref 135–146)
Total Bilirubin: 0.8 mg/dL (ref 0.2–1.2)
Total Protein: 7.3 g/dL (ref 6.1–8.1)

## 2015-09-28 LAB — CBC
HCT: 42.7 % (ref 35.0–45.0)
Hemoglobin: 14.1 g/dL (ref 11.7–15.5)
MCH: 33.7 pg — AB (ref 27.0–33.0)
MCHC: 33 g/dL (ref 32.0–36.0)
MCV: 102.2 fL — AB (ref 80.0–100.0)
MPV: 10.2 fL (ref 7.5–12.5)
PLATELETS: 236 10*3/uL (ref 140–400)
RBC: 4.18 MIL/uL (ref 3.80–5.10)
RDW: 13.3 % (ref 11.0–15.0)
WBC: 5.7 10*3/uL (ref 3.8–10.8)

## 2015-09-28 LAB — HIV ANTIBODY (ROUTINE TESTING W REFLEX): HIV 1&2 Ab, 4th Generation: NONREACTIVE

## 2015-09-28 LAB — HEPATITIS C ANTIBODY: HCV Ab: NEGATIVE

## 2015-09-28 LAB — TSH: TSH: 2.27 m[IU]/L

## 2015-09-28 MED ORDER — PROGESTERONE MICRONIZED 100 MG PO CAPS: ORAL_CAPSULE | ORAL | 11 refills | Status: DC

## 2015-09-28 MED ORDER — ESTRADIOL 0.05 MG/24HR TD PTTW: MEDICATED_PATCH | TRANSDERMAL | 11 refills | Status: DC

## 2015-09-28 NOTE — Patient Instructions (Signed)

## 2015-09-29 LAB — VITAMIN D 25 HYDROXY (VIT D DEFICIENCY, FRACTURES): Vit D, 25-Hydroxy: 46 ng/mL (ref 30–100)

## 2015-10-19 ENCOUNTER — Ambulatory Visit: Payer: BLUE CROSS/BLUE SHIELD

## 2015-10-20 ENCOUNTER — Ambulatory Visit
Admission: RE | Admit: 2015-10-20 | Discharge: 2015-10-20 | Disposition: A | Discharge status: Home / Self Care | Payer: BLUE CROSS/BLUE SHIELD | Source: Ambulatory Visit | Attending: Obstetrics and Gynecology | Admitting: Obstetrics and Gynecology

## 2015-10-20 DIAGNOSIS — Z1231 Encounter for screening mammogram for malignant neoplasm of breast: Secondary | ICD-10-CM

## 2016-10-19 ENCOUNTER — Ambulatory Visit: Payer: BLUE CROSS/BLUE SHIELD | Admitting: Obstetrics and Gynecology

## 2016-10-29 ENCOUNTER — Other Ambulatory Visit: Payer: Self-pay | Admitting: Obstetrics and Gynecology

## 2016-10-29 DIAGNOSIS — Z1231 Encounter for screening mammogram for malignant neoplasm of breast: Secondary | ICD-10-CM

## 2016-10-29 NOTE — Telephone Encounter (Signed)
Medication refill request: Estradiol and Progesterone Last AEX:  09/28/15 BS Next AEX: 11/19/16  Last MMG (if hormonal medication request): 10/20/15 10/20/15 BIRADS 1 negative/density c Refill authorized: 09/28/15 #8 patch w/11 refills; today please advise. Spoke with patient, she states that she will be calling TBC to schedule MMG.

## 2016-11-06 ENCOUNTER — Ambulatory Visit
Admission: RE | Admit: 2016-11-06 | Discharge: 2016-11-06 | Disposition: A | Discharge status: Home / Self Care | Payer: BLUE CROSS/BLUE SHIELD | Source: Ambulatory Visit | Attending: Obstetrics and Gynecology | Admitting: Obstetrics and Gynecology

## 2016-11-06 DIAGNOSIS — Z1231 Encounter for screening mammogram for malignant neoplasm of breast: Secondary | ICD-10-CM

## 2016-11-08 ENCOUNTER — Other Ambulatory Visit: Payer: Self-pay | Admitting: Obstetrics and Gynecology

## 2016-11-08 DIAGNOSIS — R928 Other abnormal and inconclusive findings on diagnostic imaging of breast: Secondary | ICD-10-CM

## 2016-11-09 ENCOUNTER — Other Ambulatory Visit: Payer: Self-pay | Admitting: Obstetrics and Gynecology

## 2016-11-09 ENCOUNTER — Ambulatory Visit
Admission: RE | Admit: 2016-11-09 | Discharge: 2016-11-09 | Disposition: A | Discharge status: Home / Self Care | Payer: BLUE CROSS/BLUE SHIELD | Source: Ambulatory Visit | Attending: Obstetrics and Gynecology | Admitting: Obstetrics and Gynecology

## 2016-11-09 DIAGNOSIS — R928 Other abnormal and inconclusive findings on diagnostic imaging of breast: Secondary | ICD-10-CM

## 2016-11-09 DIAGNOSIS — N63 Unspecified lump in unspecified breast: Secondary | ICD-10-CM

## 2016-11-13 ENCOUNTER — Other Ambulatory Visit: Payer: BLUE CROSS/BLUE SHIELD

## 2016-11-19 ENCOUNTER — Encounter: Payer: Self-pay | Admitting: Obstetrics and Gynecology

## 2016-11-19 ENCOUNTER — Other Ambulatory Visit (HOSPITAL_COMMUNITY)
Admission: RE | Admit: 2016-11-19 | Discharge: 2016-11-19 | Disposition: A | Discharge status: Home / Self Care | Payer: BLUE CROSS/BLUE SHIELD | Source: Ambulatory Visit | Attending: Obstetrics and Gynecology | Admitting: Obstetrics and Gynecology

## 2016-11-19 ENCOUNTER — Ambulatory Visit (INDEPENDENT_AMBULATORY_CARE_PROVIDER_SITE_OTHER): Payer: BLUE CROSS/BLUE SHIELD | Admitting: Obstetrics and Gynecology

## 2016-11-19 VITALS — BP 110/70 | HR 76 | Resp 16 | Ht 64.5 in | Wt 136.0 lb

## 2016-11-19 DIAGNOSIS — R718 Other abnormality of red blood cells: Secondary | ICD-10-CM

## 2016-11-19 DIAGNOSIS — Z01419 Encounter for gynecological examination (general) (routine) without abnormal findings: Secondary | ICD-10-CM | POA: Insufficient documentation

## 2016-11-19 MED ORDER — ESTRADIOL 0.0375 MG/24HR TD PTTW: 1.0000 | MEDICATED_PATCH | TRANSDERMAL | 11 refills | Status: DC

## 2016-11-19 MED ORDER — PROGESTERONE MICRONIZED 100 MG PO CAPS: ORAL_CAPSULE | ORAL | 11 refills | Status: DC

## 2016-11-19 NOTE — Progress Notes (Signed)
56 y.o. G71P2011 Married Caucasian female here for annual exam.    On HRT.   States she never had chicken pox.   Due to begin light therapy for her skin at [facility].   PCP: No PCP   Dermatology:  [facility].   Patient's last menstrual period was 02/05/2009 (approximate).           Sexually active: Yes.    The current method of family planning is post menopausal status.    Exercising: Yes.    tennis, running, walking, and weights  Smoker:  no  Health Maintenance: Pap:   08-13-13 Neg:Neg HR HPV History of abnormal Pap:  no MMG:  11/09/16 [place] Bilateral  - BIRADS 3 probably benign/density c -- see EPIC for details.  Will FU in April 2018 for right breast US. Colonoscopy:  2011 normal with Dr. Ulyses Southward due 2021 BMD:   n/a  Result  n/a TDaP:  2013 Gardasil:   N/A HIV and Hep C: 09/28/15 Negative Screening Labs:  Discuss today   reports that she has never smoked. She has never used smokeless tobacco. She reports that she drinks about 1.8 oz of alcohol per week . She reports that she does not use drugs.  History reviewed. No pertinent past medical history.  Past Surgical History:  Procedure Laterality Date  . APPENDECTOMY      Current Outpatient Prescriptions  Medication Sig Dispense Refill  . CALCIUM PO Take by mouth. occ    . estradiol (VIVELLE-DOT) 0.05 MG/24HR patch APPLY 1 PATCH(0.05 MG) EXTERNALLY TO THE SKIN 2 TIMES A WEEK 8 patch 0  . progesterone (PROMETRIUM) 100 MG capsule TAKE 1 CAPSULE(100 MG) BY MOUTH AT BEDTIME 30 capsule 0   No current facility-administered medications for this visit.     Family History  Problem Relation Age of Onset  . Osteoporosis Mother   . Hypertension Father   . Osteoporosis Maternal Grandmother     ROS:  Pertinent items are noted in HPI.  Otherwise, a comprehensive ROS was negative.  Exam:   BP 110/70 (BP Location: Right Arm, Patient Position: Sitting, Cuff Size: Normal)   Pulse 76   Resp 16   Ht 5' 4.5" (1.638  m)   Wt 136 lb (61.7 kg)   LMP 02/05/2009 (Approximate)   BMI 22.98 kg/m     General appearance: alert, cooperative and appears stated age Head: Normocephalic, without obvious abnormality, atraumatic Neck: no adenopathy, supple, symmetrical, trachea midline and thyroid normal to inspection and palpation Lungs: clear to auscultation bilaterally Breasts: normal appearance,  2 cm smooth left breast mass at 6:00, no masses on right breast, no nipple retraction or dimpling, No nipple discharge or bleeding, No axillary or supraclavicular adenopathy Heart: regular rate and rhythm Abdomen: soft, non-tender; no masses, no organomegaly Extremities: extremities normal, atraumatic, no cyanosis or edema Skin: Skin color, texture, turgor normal. No rashes or lesions Lymph nodes: Cervical, supraclavicular, and axillary nodes normal. No abnormal inguinal nodes palpated Neurologic: Grossly normal  Pelvic: External genitalia:  no lesions              Urethra:  normal appearing urethra with no masses, tenderness or lesions              Bartholins and Skenes: normal                 Vagina: normal appearing vagina with normal color and discharge, no lesions  Cervix: no lesions              Pap taken: Yes.   Bimanual Exam:  Uterus:  normal size, contour, position, consistency, mobility, non-tender              Adnexa: no mass, fullness, tenderness              Rectal exam: Yes.  .  Confirms.              Anus:  normal sphincter tone, no lesions  Chaperone was present for exam.  Assessment:   Well woman visit. Bilateral breast cysts. HRT.  FH osteoporosis.  FH dementia.  Plan: Mammogram screening discussed.  She has a breast US scheduled for April 2019.  Recommended self breast awareness. Pap and HR HPV as above. Guidelines for Calcium, Vitamin D, regular exercise program including cardiovascular and weight bearing exercise. CBC, check varicella IgG. We discussed the WHI and risks  and benefits of HRT.  Risks include MI, stroke, DVT, PE, and breast cancer.  She will wean down on HRT - Minivelle 0.0375 mg twice weekly and Prometrium 100 mg daily.   Follow up annually and prn.   After visit summary provided.

## 2016-11-19 NOTE — Patient Instructions (Signed)

## 2016-11-20 LAB — CBC
HEMATOCRIT: 43.2 % (ref 34.0–46.6)
HEMOGLOBIN: 13.8 g/dL (ref 11.1–15.9)
MCH: 33.2 pg — AB (ref 26.6–33.0)
MCHC: 31.9 g/dL (ref 31.5–35.7)
MCV: 104 fL — ABNORMAL HIGH (ref 79–97)
Platelets: 240 10*3/uL (ref 150–379)
RBC: 4.16 x10E6/uL (ref 3.77–5.28)
RDW: 13.5 % (ref 12.3–15.4)
WBC: 5.7 10*3/uL (ref 3.4–10.8)

## 2016-11-20 LAB — VARICELLA ZOSTER ANTIBODY, IGG

## 2016-11-20 LAB — CYTOLOGY - PAP
DIAGNOSIS: NEGATIVE
HPV: NOT DETECTED

## 2016-11-24 NOTE — Addendum Note (Signed)
Addended by: Yisroel Ramming, Eura Radabaugh E on: 11/24/2016 10:35 AM   Modules accepted: Orders

## 2016-11-29 ENCOUNTER — Telehealth: Payer: Self-pay | Admitting: Obstetrics and Gynecology

## 2016-11-29 NOTE — Telephone Encounter (Signed)
Please contact patient regarding my recommendation for a vit B12 and folate level check.  I have already placed future orders. Her parameters on her CBC suggested possible deficiency in theses.

## 2016-11-29 NOTE — Telephone Encounter (Signed)
Spoke with patient. Patient would like to schedule lab appointment to have vit B12 and folate levels checked. Appointment scheduled for 12/05/2016 at 2:30 pm. Patient is agreeable to date and time. Encounter closed.

## 2016-12-03 ENCOUNTER — Telehealth: Payer: Self-pay | Admitting: Obstetrics and Gynecology

## 2016-12-03 NOTE — Telephone Encounter (Signed)
Patient called and cancelled her upcoming lab appointment for vitamin B12 and folate. She said she is going to call back and reschedule or pick a new primary care doctor and have the labs done there instead.

## 2016-12-03 NOTE — Telephone Encounter (Signed)
Thank you for the update.  Encounter closed. 

## 2016-12-05 ENCOUNTER — Other Ambulatory Visit: Payer: BLUE CROSS/BLUE SHIELD

## 2017-05-13 ENCOUNTER — Ambulatory Visit
Admission: RE | Admit: 2017-05-13 | Discharge: 2017-05-13 | Disposition: A | Discharge status: Home / Self Care | Payer: BLUE CROSS/BLUE SHIELD | Source: Ambulatory Visit | Attending: Obstetrics and Gynecology | Admitting: Obstetrics and Gynecology

## 2017-05-13 DIAGNOSIS — N63 Unspecified lump in unspecified breast: Secondary | ICD-10-CM

## 2017-06-14 ENCOUNTER — Other Ambulatory Visit: Payer: Self-pay | Admitting: Obstetrics and Gynecology

## 2017-11-05 DIAGNOSIS — C801 Malignant (primary) neoplasm, unspecified: Secondary | ICD-10-CM

## 2017-11-05 HISTORY — PX: OTHER SURGICAL HISTORY: SHX169

## 2017-11-05 HISTORY — DX: Malignant (primary) neoplasm, unspecified: C80.1

## 2017-11-29 ENCOUNTER — Other Ambulatory Visit: Payer: Self-pay | Admitting: Obstetrics and Gynecology

## 2017-11-29 DIAGNOSIS — Z1231 Encounter for screening mammogram for malignant neoplasm of breast: Secondary | ICD-10-CM

## 2017-11-29 NOTE — Progress Notes (Signed)
57 y.o. G25P2011 Married Caucasian female here for annual exam.    On HRT.  No vaginal bleeding.   Dad has Alzheimer's.  Stress due to this.   PCP:  None   Patient's last menstrual period was 02/05/2009 (approximate).           Sexually active: Yes.   female The current method of family planning is post menopausal status.    Exercising: Yes.    strength training, tennis, walks and running. Smoker:  no  Health Maintenance: Pap: 11-19-16 Neg:Neg HR HPV, 08-13-13 Neg:Neg HR HPV History of abnormal Pap:  no MMG: 11-06-16 Density C/poss Rt.Br.mass, poss Lt.Br.mass--Bil.Diag.w/Br.U/S reveals Probably benign right breast probable cyst cluster. Recommendation is for six-month ultrasound follow-up. Benign left breast simple cyst. No further imaging follow-up Required.05-13-17 Rt.Br.U/S interval resolution of portion cluster of cysts. Return to screening/biRads2--appt.01-09-18 Colonoscopy:   2011 normal with Dr. Ulyses Southward due 2021 BMD: 2-3 yrs ago "heel scan"  Result  normal TDaP:  2013 Gardasil:   no HIV: 09-28-15 NR Hep C: 09-28-15 Neg Screening Labs:  Today.   reports that she has never smoked. She has never used smokeless tobacco. She reports that she drinks about 4.0 - 6.0 standard drinks of alcohol per week. She reports that she does not use drugs.  Past Medical History:  Diagnosis Date  . 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 APPENDECTOMY      Current Outpatient Medications  Medication Sig Dispense Refill  . CALCIUM PO Take by mouth. occ    . Cholecalciferol (VITAMIN D3) 1000 units CAPS Take 1 capsule by mouth daily.    [employer] estradiol (MINIVELLE) 0.0375 MG/24HR Place 1 patch onto the skin 2 (two) times a week. 8 patch 11  . progesterone (PROMETRIUM) 100 MG capsule TAKE 1 CAPSULE(100 MG) BY MOUTH AT BEDTIME 30 capsule 11   No current facility-administered medications for this visit.     Family History  Problem  Relation Age of Onset  . Osteoporosis Mother   . Hypertension Father   . Alzheimer's disease Father   . Osteoporosis Maternal Grandmother     Review of Systems  All other systems reviewed and are negative.   Exam:   BP 140/74 (BP Location: Right Arm, Patient Position: Sitting, Cuff Size: Normal)   Pulse 64   Resp 16   Ht 5' 4.5" (1.638 m)   Wt 139 lb 9.6 oz (63.3 kg)   LMP 02/05/2009 (Approximate)   BMI 23.59 kg/m     General appearance: alert, cooperative and appears stated age Head: Normocephalic, without obvious abnormality, atraumatic Neck: no adenopathy, supple, symmetrical, trachea midline and thyroid normal to inspection and palpation Lungs: clear to auscultation bilaterally Breasts: right - normal appearance, no masses or tenderness, No nipple retraction or dimpling, No nipple discharge or bleeding, No axillary or supraclavicular adenopathy Left -  normal appearance, 2 cm mobile and smooth mass at 6:00, no tenderness, No nipple retraction or dimpling, No nipple discharge or bleeding, No axillary or supraclavicular adenopathy Heart: regular rate and rhythm Abdomen: soft, non-tender; no masses, no organomegaly Extremities: extremities normal, atraumatic, no cyanosis or edema Skin: Skin color, texture, turgor normal. No rashes or lesions Lymph nodes: Cervical, supraclavicular, and axillary nodes normal. No abnormal inguinal nodes palpated Neurologic: Grossly normal  Pelvic: External genitalia:  no lesions              Urethra:  normal appearing urethra  with no masses, tenderness or lesions              Bartholins and Skenes: normal                 Vagina: normal appearing vagina with normal color and discharge, no lesions              Cervix: no lesions              Pap taken: No. Bimanual Exam:  Uterus:  normal size, contour, position, consistency, mobility, non-tender              Adnexa: no mass, fullness, tenderness              Rectal exam: Yes.  .  Confirms.               Anus:  normal sphincter tone, no lesions  Chaperone was present for exam.  Assessment:   Well woman visit with normal exam. Left breast mass. HRT.  FH osteoporosis.  FH dementia.  Plan: Mammogram bilateral dx and left breast US.  Recommended self breast awareness. Pap and HR HPV as above. Guidelines for Calcium, Vitamin D, regular exercise program including cardiovascular and weight bearing exercise. Continue on HRT for 3 more months and then stop.  She will cut her estrogen patch in half.  We discussed WHI and reviewed risks of stroke, DVT, PE, MI, breast cancer, and dementia. Routine labs. Flu vaccine recommended.  Follow up annually and prn.   After visit summary provided.

## 2017-11-29 NOTE — Progress Notes (Deleted)
57 y.o. G64P2011 Married Caucasian female here for annual exam.    PCP:     Patient's last menstrual period was 02/05/2009 (approximate).           Sexually active: {yes no:314532}  The current method of family planning is post menopausal status.    Exercising: {yes no:314532}  {types:19826} Smoker:  no  Health Maintenance: Pap: 11-06-16 Neg:Neg HR HPV, 08-13-13 Neg:Neg HR HPV History of abnormal Pap:  {YES NO:22349} MMG: 11-06-16  Colonoscopy:  *** BMD:   ***  Result  *** TDaP:  *** Gardasil:   {YES NO:22349} HIV: 09-28-15 NR Hep C:09-28-15 Neg Screening Labs:  Hb today: ***, Urine today: ***   reports that she has never smoked. She has never used smokeless tobacco. She reports that she drinks about 3.0 standard drinks of alcohol per week. She reports that she does not use drugs.  No past medical history on file.  Past Surgical History:  Procedure Laterality Date  . APPENDECTOMY      Current Outpatient Medications  Medication Sig Dispense Refill  . CALCIUM PO Take by mouth. occ    . estradiol (MINIVELLE) 0.0375 MG/24HR Place 1 patch onto the skin 2 (two) times a week. 8 patch 11  . progesterone (PROMETRIUM) 100 MG capsule TAKE 1 CAPSULE(100 MG) BY MOUTH AT BEDTIME 30 capsule 11   No current facility-administered medications for this visit.     Family History  Problem Relation Age of Onset  . Osteoporosis Mother   . Hypertension Father   . Osteoporosis Maternal Grandmother     Review of Systems  Exam:   LMP 02/05/2009 (Approximate)     General appearance: alert, cooperative and appears stated age Head: Normocephalic, without obvious abnormality, atraumatic Neck: no adenopathy, supple, symmetrical, trachea midline and thyroid normal to inspection and palpation Lungs: clear to auscultation bilaterally Breasts: normal appearance, no masses or tenderness, No nipple retraction or dimpling, No nipple discharge or bleeding, No axillary or supraclavicular adenopathy Heart:  regular rate and rhythm Abdomen: soft, non-tender; no masses, no organomegaly Extremities: extremities normal, atraumatic, no cyanosis or edema Skin: Skin color, texture, turgor normal. No rashes or lesions Lymph nodes: Cervical, supraclavicular, and axillary nodes normal. No abnormal inguinal nodes palpated Neurologic: Grossly normal  Pelvic: External genitalia:  no lesions              Urethra:  normal appearing urethra with no masses, tenderness or lesions              Bartholins and Skenes: normal                 Vagina: normal appearing vagina with normal color and discharge, no lesions              Cervix: no lesions              Pap taken: {yes no:314532} Bimanual Exam:  Uterus:  normal size, contour, position, consistency, mobility, non-tender              Adnexa: no mass, fullness, tenderness              Rectal exam: {yes no:314532}.  Confirms.              Anus:  normal sphincter tone, no lesions  Chaperone was present for exam.  Assessment:   Well woman visit with normal exam.   Plan: Mammogram screening. Recommended self breast awareness. Pap and HR HPV as above. Guidelines for Calcium, Vitamin D,  regular exercise program including cardiovascular and weight bearing exercise.   Follow up annually and prn.   Additional counseling given.  {yes [id number]. _______ minutes face to face time of which over 50% was spent in counseling.    After visit summary provided.

## 2017-12-04 ENCOUNTER — Ambulatory Visit (INDEPENDENT_AMBULATORY_CARE_PROVIDER_SITE_OTHER): Payer: 59 | Admitting: Obstetrics and Gynecology

## 2017-12-04 ENCOUNTER — Ambulatory Visit: Payer: BLUE CROSS/BLUE SHIELD | Admitting: Obstetrics and Gynecology

## 2017-12-04 ENCOUNTER — Encounter: Payer: Self-pay | Admitting: Obstetrics and Gynecology

## 2017-12-04 ENCOUNTER — Other Ambulatory Visit: Payer: Self-pay

## 2017-12-04 VITALS — BP 140/74 | HR 64 | Resp 16 | Ht 64.5 in | Wt 139.6 lb

## 2017-12-04 DIAGNOSIS — Z01419 Encounter for gynecological examination (general) (routine) without abnormal findings: Secondary | ICD-10-CM

## 2017-12-04 DIAGNOSIS — N6320 Unspecified lump in the left breast, unspecified quadrant: Secondary | ICD-10-CM | POA: Diagnosis not present

## 2017-12-04 MED ORDER — PROGESTERONE MICRONIZED 100 MG PO CAPS: ORAL_CAPSULE | ORAL | 1 refills | Status: DC

## 2017-12-04 MED ORDER — ESTRADIOL 0.0375 MG/24HR TD PTTW: 1.0000 | MEDICATED_PATCH | TRANSDERMAL | 1 refills | Status: DC

## 2017-12-04 NOTE — Progress Notes (Signed)
Patient is scheduled for Bilateral Breast Diagnostic Mammogram and L Breast Ultrasound at [facility] on 12/09/17 at 2:10 . Patient agreeable to time/date/location.

## 2017-12-04 NOTE — Patient Instructions (Signed)

## 2017-12-05 LAB — LIPID PANEL
CHOLESTEROL TOTAL: 266 mg/dL — AB (ref 100–199)
Chol/HDL Ratio: 2.2 ratio (ref 0.0–4.4)
HDL: 122 mg/dL (ref >39)
LDL Calculated: 134 mg/dL — ABNORMAL HIGH (ref 0–99)
Triglycerides: 51 mg/dL (ref 0–149)
VLDL CHOLESTEROL CAL: 10 mg/dL (ref 5–40)

## 2017-12-05 LAB — CBC
HEMATOCRIT: 40.4 % (ref 34.0–46.6)
Hemoglobin: 13.4 g/dL (ref 11.1–15.9)
MCH: 33.8 pg — AB (ref 26.6–33.0)
MCHC: 33.2 g/dL (ref 31.5–35.7)
MCV: 102 fL — AB (ref 79–97)
PLATELETS: 225 10*3/uL (ref 150–450)
RBC: 3.97 x10E6/uL (ref 3.77–5.28)
RDW: 12.4 % (ref 12.3–15.4)
WBC: 5.1 10*3/uL (ref 3.4–10.8)

## 2017-12-05 LAB — COMPREHENSIVE METABOLIC PANEL
ALT: 29 IU/L (ref 0–32)
AST: 29 IU/L (ref 0–40)
Albumin/Globulin Ratio: 1.8 (ref 1.2–2.2)
Albumin: 4.6 g/dL (ref 3.5–5.5)
Alkaline Phosphatase: 55 IU/L (ref 39–117)
BILIRUBIN TOTAL: 0.5 mg/dL (ref 0.0–1.2)
BUN/Creatinine Ratio: 19 (ref 9–23)
BUN: 17 mg/dL (ref 6–24)
CALCIUM: 9.6 mg/dL (ref 8.7–10.2)
CHLORIDE: 101 mmol/L (ref 96–106)
CO2: 24 mmol/L (ref 20–29)
Creatinine, Ser: 0.89 mg/dL (ref 0.57–1.00)
GFR, EST AFRICAN AMERICAN: 82 mL/min/{1.73_m2} (ref >59)
GFR, EST NON AFRICAN AMERICAN: 71 mL/min/{1.73_m2} (ref >59)
GLOBULIN, TOTAL: 2.6 g/dL (ref 1.5–4.5)
Glucose: 82 mg/dL (ref 65–99)
Potassium: 4.1 mmol/L (ref 3.5–5.2)
SODIUM: 139 mmol/L (ref 134–144)
Total Protein: 7.2 g/dL (ref 6.0–8.5)

## 2017-12-05 LAB — TSH: TSH: 2.84 u[IU]/mL (ref 0.450–4.500)

## 2017-12-05 LAB — VITAMIN D 25 HYDROXY (VIT D DEFICIENCY, FRACTURES): Vit D, 25-Hydroxy: 44.5 ng/mL (ref 30.0–100.0)

## 2017-12-09 ENCOUNTER — Ambulatory Visit
Admission: RE | Admit: 2017-12-09 | Discharge: 2017-12-09 | Disposition: A | Discharge status: Home / Self Care | Payer: 59 | Source: Ambulatory Visit | Attending: Obstetrics and Gynecology | Admitting: Obstetrics and Gynecology

## 2017-12-09 ENCOUNTER — Other Ambulatory Visit (HOSPITAL_COMMUNITY)
Admission: RE | Admit: 2017-12-09 | Discharge: 2017-12-09 | Disposition: A | Discharge status: Home / Self Care | Payer: 59 | Source: Ambulatory Visit | Attending: Obstetrics and Gynecology | Admitting: Obstetrics and Gynecology

## 2017-12-09 DIAGNOSIS — N6320 Unspecified lump in the left breast, unspecified quadrant: Secondary | ICD-10-CM

## 2017-12-22 ENCOUNTER — Other Ambulatory Visit: Payer: Self-pay | Admitting: Obstetrics and Gynecology

## 2017-12-30 ENCOUNTER — Telehealth: Payer: Self-pay | Admitting: Emergency Medicine

## 2017-12-30 NOTE — Telephone Encounter (Signed)
Patient called with questions about why she was ordered a bilateral diagnostic mammogram.   Advised she was due for screening and since she was due for a screening, bilateral diagnostic imaging necessary as not clinically appropriate to have diagnostic imaging of one breast and then screening on the other breast.   Patient states her insurance did not cover any portion of the exam, because it is billed as a diagnostic exam and not screening.  Apologies offered for any miscommunication. Pt understands reason for ordering as is.  Encounter to Dr. Quincy Simmonds and will close.

## 2017-12-31 NOTE — Telephone Encounter (Signed)
Forwarding to nursing supervisor.  Cc- Lamont Snowball

## 2018-01-09 ENCOUNTER — Ambulatory Visit: Payer: 59

## 2018-01-11 ENCOUNTER — Other Ambulatory Visit: Payer: Self-pay | Admitting: Obstetrics and Gynecology

## 2018-01-13 NOTE — Telephone Encounter (Signed)
Medication refill request: Progesterone Last AEX:  12/04/17 BS Next AEX: 12/08/18  Last MMG (if hormonal medication request): 12/09/17 Diagnostic Bilateral MMG/Left Breast US - BIRADS 2 benign/density c Refill authorized: 12/04/17 #30 w/1 refill; today please advise

## 2018-12-04 ENCOUNTER — Other Ambulatory Visit: Payer: Self-pay

## 2018-12-04 NOTE — Progress Notes (Signed)
58 y.o. G75P2011 Married Caucasian female here for annual exam.    She is now off her HRT.  No real hot flashes but does feel warm.  Having vaginal dryness.   Denies vaginal bleeding.   Patient's dad passed 05/2018 from sepsis. He had Alzheimer's/ Daughter marrying in April.   PCP:  None   Patient's last menstrual period was 02/05/2009 (approximate).           Sexually active: Yes.    The current method of family planning is post menopausal status.    Exercising: Yes.    tennis and has trainer 2x/week, walks Smoker:  no  Health Maintenance: Pap:  11-19-16 Neg:Neg HR HPV, 08-13-13 Neg:Neg HR HPV History of abnormal Pap:  no MMG: 12-09-17 Diag.Bil & Lt.Br.US--Benign Lt.Br.cysts/Rt.Br.Neg/density C/BiRads2--appt. 01-26-19 Colonoscopy: 2011 normal;next due 2021 BMD: 2-3 yrs ago "heel scan"     Result :Normal TDaP:  2013 Gardasil:   n/a HIV:09-28-15 NR Hep C:: 09-28-15 Neg Screening Labs:   Will do with PCP.  Flu vaccine:  Done.    reports that she has never smoked. She has never used smokeless tobacco. She reports current alcohol use of about 4.0 - 6.0 standard drinks of alcohol per week. She reports that she does not use drugs.  Past Medical History:  Diagnosis Date  . 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 APPENDECTOMY    . mohls  11/2017   nose    Current Outpatient Medications  Medication Sig Dispense Refill  . acyclovir (ZOVIRAX) 800 MG tablet Take 800 mg by mouth 2 (two) times daily as needed.    Marland Kitchen CALCIUM PO Take by mouth. occ    . Cholecalciferol (VITAMIN D3) 1000 units CAPS Take 1 capsule by mouth daily.     No current facility-administered medications for this visit.     Family History  Problem Relation Age of Onset  . Osteoporosis Mother   . Hypertension Father   . Alzheimer's disease Father   . Osteoporosis Maternal Grandmother     Review of Systems  All other systems reviewed and are  negative.   Exam:   BP (!) 144/78   Pulse 76   Temp 98 F (36.7 C) (Temporal)   Resp 16   Ht 5' 4.5" (1.638 m)   Wt 133 lb 9.6 oz (60.6 kg)   LMP 02/05/2009 (Approximate)   BMI 22.58 kg/m     General appearance: alert, cooperative and appears stated age Head: normocephalic, without obvious abnormality, atraumatic Neck: no adenopathy, supple, symmetrical, trachea midline and thyroid normal to inspection and palpation Lungs: clear to auscultation bilaterally Breasts: right - normal appearance, no masses or tenderness, No nipple retraction or dimpling, No nipple discharge or bleeding, No axillary adenopathy Left - 1.5 cm mass at 6:00 (Old change.)  No tenderness, No nipple retraction or dimpling, No nipple discharge or bleeding, No axillary adenopathy Heart: regular rate and rhythm Abdomen: soft, non-tender; no masses, no organomegaly Extremities: extremities normal, atraumatic, no cyanosis or edema Skin: skin color, texture, turgor normal. No rashes or lesions Lymph nodes: cervical, supraclavicular, and axillary nodes normal. Neurologic: grossly normal  Pelvic: External genitalia:  no lesions              No abnormal inguinal nodes palpated.              Urethra:  normal appearing urethra with no masses, tenderness or lesions  Bartholins and Skenes: normal                 Vagina: normal appearing vagina with normal color and discharge, no lesions              Cervix: no lesions              Pap taken: No. Bimanual Exam:  Uterus:  normal size, contour, position, consistency, mobility, non-tender              Adnexa: no mass, fullness, tenderness              Rectal exam: Yes.  .  Confirms.              Anus:  normal sphincter tone, no lesions  Chaperone was present for exam.  Assessment:   Well woman visit with normal exam. FH osteoporosis.  FH dementia. Left breast mass.  Stable.  Documented cyst noted previously.   Plan: Mammogram screening discussed. Self  breast awareness reviewed. Pap and HR HPV as above.  Next check in 2023.  Guidelines for Calcium, Vitamin D, regular exercise program including cardiovascular and weight bearing exercise. Start vaginal Vit E.  Routine labs with PCP.  Colonoscopy next year.  Follow up annually and prn.   After visit summary provided.

## 2018-12-08 ENCOUNTER — Other Ambulatory Visit: Payer: Self-pay | Admitting: Obstetrics and Gynecology

## 2018-12-08 ENCOUNTER — Ambulatory Visit (INDEPENDENT_AMBULATORY_CARE_PROVIDER_SITE_OTHER): Payer: 59 | Admitting: Obstetrics and Gynecology

## 2018-12-08 ENCOUNTER — Encounter: Payer: Self-pay | Admitting: Obstetrics and Gynecology

## 2018-12-08 ENCOUNTER — Other Ambulatory Visit: Payer: Self-pay

## 2018-12-08 VITALS — BP 144/78 | HR 76 | Temp 98.0°F | Resp 16 | Ht 64.5 in | Wt 133.6 lb

## 2018-12-08 DIAGNOSIS — Z1231 Encounter for screening mammogram for malignant neoplasm of breast: Secondary | ICD-10-CM

## 2018-12-08 DIAGNOSIS — Z01419 Encounter for gynecological examination (general) (routine) without abnormal findings: Secondary | ICD-10-CM

## 2018-12-08 NOTE — Patient Instructions (Signed)
Try vaginal vitamin E suppositories.  You may purchase this on the Internet.   EXERCISE AND DIET:  We recommended that you start or continue a regular exercise program for good health. Regular exercise means any activity that makes your heart beat faster and makes you sweat.  We recommend exercising at least 30 minutes per day at least 3 days a week, preferably 4 or 5.  We also recommend a diet low in fat and sugar.  Inactivity, poor dietary choices and obesity can cause diabetes, heart attack, stroke, and kidney damage, among others.    ALCOHOL AND SMOKING:  Women should limit their alcohol intake to no more than 7 drinks/beers/glasses of wine (combined, not each!) per week. Moderation of alcohol intake to this level decreases your risk of breast cancer and liver damage. And of course, no recreational drugs are part of a healthy lifestyle.  And absolutely no smoking or even second hand smoke. Most people know smoking can cause heart and lung diseases, but did you know it also contributes to weakening of your bones? Aging of your skin?  Yellowing of your teeth and nails?  CALCIUM AND VITAMIN D:  Adequate intake of calcium and Vitamin D are recommended.  The recommendations for exact amounts of these supplements seem to change often, but generally speaking 600 mg of calcium (either carbonate or citrate) and 800 units of Vitamin D per day seems prudent. Certain women may benefit from higher intake of Vitamin D.  If you are among these women, your doctor will have told you during your visit.    PAP SMEARS:  Pap smears, to check for cervical cancer or precancers,  have traditionally been done yearly, although recent scientific advances have shown that most women can have pap smears less often.  However, every woman still should have a physical exam from her gynecologist every year. It will include a breast check, inspection of the vulva and vagina to check for abnormal growths or skin changes, a visual exam of  the cervix, and then an exam to evaluate the size and shape of the uterus and ovaries.  And after 60 years of age, a rectal exam is indicated to check for rectal cancers. We will also provide age appropriate advice regarding health maintenance, like when you should have certain vaccines, screening for sexually transmitted diseases, bone density testing, colonoscopy, mammograms, etc.   MAMMOGRAMS:  All women over 73 years old should have a yearly mammogram. Many facilities now offer a "3D" mammogram, which may cost around $50 extra out of pocket. If possible,  we recommend you accept the option to have the 3D mammogram performed.  It both reduces the number of women who will be called back for extra views which then turn out to be normal, and it is better than the routine mammogram at detecting truly abnormal areas.    COLONOSCOPY:  Colonoscopy to screen for colon cancer is recommended for all women at age 49.  We know, you hate the idea of the prep.  We agree, BUT, having colon cancer and not knowing it is worse!!  Colon cancer so often starts as a polyp that can be seen and removed at colonscopy, which can quite literally save your life!  And if your first colonoscopy is normal and you have no family history of colon cancer, most women don't have to have it again for 10 years.  Once every ten years, you can do something that may end up saving your life, right?  We will be happy to help you get it scheduled when you are ready.  Be sure to check your insurance coverage so you understand how much it will cost.  It may be covered as a preventative service at no cost, but you should check your particular policy.

## 2019-01-26 ENCOUNTER — Ambulatory Visit
Admission: RE | Admit: 2019-01-26 | Discharge: 2019-01-26 | Disposition: A | Discharge status: Home / Self Care | Payer: 59 | Source: Ambulatory Visit | Attending: Obstetrics and Gynecology | Admitting: Obstetrics and Gynecology

## 2019-01-26 ENCOUNTER — Other Ambulatory Visit: Payer: Self-pay

## 2019-01-26 DIAGNOSIS — Z1231 Encounter for screening mammogram for malignant neoplasm of breast: Secondary | ICD-10-CM

## 2019-12-08 NOTE — Progress Notes (Signed)
59 y.o. G79P2011 Married Caucasian female here for annual exam.    Placed her house on the market.  Daughter married.   Received her flu vaccine and Covid vaccine.   Will do colonoscopy in Jan. 2022.  PCP:  None  Patient's last menstrual period was 02/05/2009 (approximate).           Sexually active: Yes.    The current method of family planning is post menopausal status.    Exercising: Yes.    personal trainer 2x/week, tennis and walking Smoker:  no  Health Maintenance: Pap: 11-19-16 Neg:Neg HR HPV, 08-13-13 Neg:Neg HR HPV History of abnormal Pap:  no MMG:  01-26-19 3D/Neg/density C/BiRads1 Colonoscopy: 2011 normal;next due 2021--has appt. 02/2020 BMD: ~2017 heel scan  Result :Normal TDaP:  2013 Gardasil:   no HIV:09-28-15 NR Hep C: 09-28-15 Neg Screening Labs:  PCP.   reports that she has never smoked. She has never used smokeless tobacco. She reports current alcohol use of about 3.0 - 5.0 standard drinks of alcohol per week. She reports that she does not use drugs.  Past Medical History:  Diagnosis Date  . 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 APPENDECTOMY    . mohls  11/2017   nose    Current Outpatient Medications  Medication Sig Dispense Refill  . acyclovir (ZOVIRAX) 800 MG tablet Take 800 mg by mouth 2 (two) times daily as needed.    Marland Kitchen CALCIUM PO Take by mouth. occ    . Cholecalciferol (VITAMIN D3) 1000 units CAPS Take 1 capsule by mouth daily.    . fluorouracil (EFUDEX) 5 % cream Apply 1 application topically 2 (two) times daily.    . mometasone (ELOCON) 0.1 % cream Apply topically.     No current facility-administered medications for this visit.    Family History  Problem Relation Age of Onset  . Osteoporosis Mother   . Hypertension Father   . Alzheimer's disease Father   . Osteoporosis Maternal Grandmother     Review of Systems  All other systems reviewed and are negative.   Exam:    BP (!) 142/84   Pulse 82   Ht 5' 4.5" (1.638 m)   Wt 127 lb (57.6 kg)   LMP 02/05/2009 (Approximate)   SpO2 100%   BMI 21.46 kg/m     General appearance: alert, cooperative and appears stated age Head: normocephalic, without obvious abnormality, atraumatic Neck: no adenopathy, supple, symmetrical, trachea midline and thyroid normal to inspection and palpation Lungs: clear to auscultation bilaterally Breasts: normal appearance, no masses or tenderness, No nipple retraction or dimpling, No nipple discharge or bleeding, No axillary adenopathy Heart: regular rate and rhythm Abdomen: soft, non-tender; no masses, no organomegaly Extremities: extremities normal, atraumatic, no cyanosis or edema Skin: skin color, texture, turgor normal. No rashes or lesions Lymph nodes: cervical, supraclavicular, and axillary nodes normal. Neurologic: grossly normal  Pelvic: External genitalia:  no lesions              No abnormal inguinal nodes palpated.              Urethra:  normal appearing urethra with no masses, tenderness or lesions              Bartholins and Skenes: normal                 Vagina: normal appearing vagina with normal color and discharge, no lesions  Cervix: no lesions              Pap taken: No. Bimanual Exam:  Uterus:  normal size, contour, position, consistency, mobility, non-tender              Adnexa: no mass, fullness, tenderness              Rectal exam: Yes.  .  Confirms.              Anus:  normal sphincter tone, no lesions  Chaperone was present for exam.  Assessment:   Well woman visit with normal exam. FH osteoporosis.   Plan: Mammogram screening discussed. Self breast awareness reviewed. Pap and HR HPV as above. Guidelines for Calcium, Vitamin D, regular exercise program including cardiovascular and weight bearing exercise. List of PCPs to patient.  Follow up annually and prn.      After visit summary provided.

## 2019-12-09 ENCOUNTER — Encounter: Payer: Self-pay | Admitting: Obstetrics and Gynecology

## 2019-12-09 ENCOUNTER — Ambulatory Visit (INDEPENDENT_AMBULATORY_CARE_PROVIDER_SITE_OTHER): Payer: 59 | Admitting: Obstetrics and Gynecology

## 2019-12-09 ENCOUNTER — Other Ambulatory Visit: Payer: Self-pay

## 2019-12-09 VITALS — BP 142/84 | HR 82 | Ht 64.5 in | Wt 127.0 lb

## 2019-12-09 DIAGNOSIS — Z01419 Encounter for gynecological examination (general) (routine) without abnormal findings: Secondary | ICD-10-CM | POA: Diagnosis not present

## 2019-12-09 NOTE — Patient Instructions (Addendum)
Consider [facility] for your primary care provider.   EXERCISE AND DIET:  We recommended that you start or continue a regular exercise program for good health. Regular exercise means any activity that makes your heart beat faster and makes you sweat.  We recommend exercising at least 30 minutes per day at least 3 days a week, preferably 4 or 5.  We also recommend a diet low in fat and sugar.  Inactivity, poor dietary choices and obesity can cause diabetes, heart attack, stroke, and kidney damage, among others.    ALCOHOL AND SMOKING:  Women should limit their alcohol intake to no more than 7 drinks/beers/glasses of wine (combined, not each!) per week. Moderation of alcohol intake to this level decreases your risk of breast cancer and liver damage. And of course, no recreational drugs are part of a healthy lifestyle.  And absolutely no smoking or even second hand smoke. Most people know smoking can cause heart and lung diseases, but did you know it also contributes to weakening of your bones? Aging of your skin?  Yellowing of your teeth and nails?  CALCIUM AND VITAMIN D:  Adequate intake of calcium and Vitamin D are recommended.  The recommendations for exact amounts of these supplements seem to change often, but generally speaking 600 mg of calcium (either carbonate or citrate) and 800 units of Vitamin D per day seems prudent. Certain women may benefit from higher intake of Vitamin D.  If you are among these women, your doctor will have told you during your visit.    PAP SMEARS:  Pap smears, to check for cervical cancer or precancers,  have traditionally been done yearly, although recent scientific advances have shown that most women can have pap smears less often.  However, every woman still should have a physical exam from her gynecologist every year. It will include a breast check, inspection of the vulva and vagina to check for abnormal growths or skin changes, a visual exam of the  cervix, and then an exam to evaluate the size and shape of the uterus and ovaries.  And after 61 years of age, a rectal exam is indicated to check for rectal cancers. We will also provide age appropriate advice regarding health maintenance, like when you should have certain vaccines, screening for sexually transmitted diseases, bone density testing, colonoscopy, mammograms, etc.   MAMMOGRAMS:  All women over 85 years old should have a yearly mammogram. Many facilities now offer a "3D" mammogram, which may cost around $50 extra out of pocket. If possible,  we recommend you accept the option to have the 3D mammogram performed.  It both reduces the number of women who will be called back for extra views which then turn out to be normal, and it is better than the routine mammogram at detecting truly abnormal areas.    COLONOSCOPY:  Colonoscopy to screen for colon cancer is recommended for all women at age 26.  We know, you hate the idea of the prep.  We agree, BUT, having colon cancer and not knowing it is worse!!  Colon cancer so often starts as a polyp that can be seen and removed at colonscopy, which can quite literally save your life!  And if your first colonoscopy is normal and you have no family history of colon cancer, most women don't have to have it again for 10 years.  Once every ten years, you can do something that may end up saving your life, right?  We will be  happy to help you get it scheduled when you are ready.  Be sure to check your insurance coverage so you understand how much it will cost.  It may be covered as a preventative service at no cost, but you should check your particular policy.

## 2020-02-18 ENCOUNTER — Other Ambulatory Visit: Payer: Self-pay | Admitting: Obstetrics and Gynecology

## 2020-02-18 DIAGNOSIS — Z1231 Encounter for screening mammogram for malignant neoplasm of breast: Secondary | ICD-10-CM

## 2020-04-01 ENCOUNTER — Ambulatory Visit: Payer: 59

## 2020-04-07 ENCOUNTER — Other Ambulatory Visit: Payer: Self-pay | Admitting: Obstetrics and Gynecology

## 2020-04-07 ENCOUNTER — Other Ambulatory Visit: Payer: Self-pay | Admitting: Internal Medicine

## 2020-04-07 DIAGNOSIS — Z13820 Encounter for screening for osteoporosis: Secondary | ICD-10-CM

## 2020-05-18 ENCOUNTER — Ambulatory Visit
Admission: RE | Admit: 2020-05-18 | Discharge: 2020-05-18 | Disposition: A | Discharge status: Home / Self Care | Payer: 59 | Source: Ambulatory Visit | Attending: Obstetrics and Gynecology | Admitting: Obstetrics and Gynecology

## 2020-05-18 ENCOUNTER — Other Ambulatory Visit: Payer: Self-pay

## 2020-05-18 DIAGNOSIS — Z1231 Encounter for screening mammogram for malignant neoplasm of breast: Secondary | ICD-10-CM

## 2020-05-23 ENCOUNTER — Other Ambulatory Visit: Payer: Self-pay | Admitting: Internal Medicine

## 2020-05-23 DIAGNOSIS — Z13820 Encounter for screening for osteoporosis: Secondary | ICD-10-CM

## 2020-09-07 ENCOUNTER — Encounter: Payer: Self-pay | Admitting: Gastroenterology

## 2020-10-26 ENCOUNTER — Other Ambulatory Visit: Payer: Self-pay

## 2020-10-26 ENCOUNTER — Ambulatory Visit
Admission: RE | Admit: 2020-10-26 | Discharge: 2020-10-26 | Disposition: A | Discharge status: Home / Self Care | Payer: 59 | Source: Ambulatory Visit | Attending: Internal Medicine | Admitting: Internal Medicine

## 2020-10-26 DIAGNOSIS — Z13820 Encounter for screening for osteoporosis: Secondary | ICD-10-CM

## 2020-12-13 ENCOUNTER — Ambulatory Visit (INDEPENDENT_AMBULATORY_CARE_PROVIDER_SITE_OTHER): Payer: 59 | Admitting: Obstetrics and Gynecology

## 2020-12-13 ENCOUNTER — Encounter: Payer: Self-pay | Admitting: Obstetrics and Gynecology

## 2020-12-13 ENCOUNTER — Other Ambulatory Visit: Payer: Self-pay

## 2020-12-13 ENCOUNTER — Ambulatory Visit: Payer: 59 | Admitting: Obstetrics and Gynecology

## 2020-12-13 VITALS — BP 130/64 | HR 74 | Ht 64.0 in | Wt 131.0 lb

## 2020-12-13 DIAGNOSIS — Z23 Encounter for immunization: Secondary | ICD-10-CM | POA: Diagnosis not present

## 2020-12-13 DIAGNOSIS — Z01419 Encounter for gynecological examination (general) (routine) without abnormal findings: Secondary | ICD-10-CM

## 2020-12-13 NOTE — Progress Notes (Signed)
60 y.o. G46P2011 Married Caucasian female here for annual exam.    Having vaginal dryness.   Taking piano lessons.  Went to [place] and sold their house.   PCP:  Cristie Hem, MD   Patient's last menstrual period was 02/05/2009 (approximate).           Sexually active: Yes.    The current method of family planning is post menopausal status.    Exercising: Yes.     Tennis, walking and personal trainer Smoker:  no  Health Maintenance: Pap:  11-19-16 Neg:Neg HR HPV, 08-13-13 Neg:Neg HR HPV History of abnormal Pap:  no MMG: 06-17-20 3D//Neg/BiRads1  Colonoscopy: 3-4-22polyp removed;next 7-10 yrs BMD:  10-26-20  Result :Osteopenia TDaP:  2013 Gardasil:   no HIV:09-28-15 NR Hep C:09-28-15 Neg Screening Labs:  PCP.  Flu vaccine:  today.    reports that she has never smoked. She has never used smokeless tobacco. She reports current alcohol use of about 3.0 - 5.0 standard drinks per week. She reports that she does not use drugs.  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 occ     Cholecalciferol (VITAMIN D3) 1000 units CAPS Take 1 capsule by mouth daily.     fluorouracil (EFUDEX) 5 % cream Apply 1 application topically 2 (two) times daily.     metroNIDAZOLE (METROGEL) 1 % gel SMARTSIG:Sparingly Topical Daily     mometasone (ELOCON) 0.1 % cream Apply topically.     No current facility-administered medications for this visit.    Family History  Problem Relation Age of Onset   Osteoporosis Mother    Hypertension Father    Alzheimer's disease Father    Osteoporosis Maternal Grandmother     Review of Systems  All other systems reviewed and are negative.  Exam:   BP 130/64   Pulse  74   Ht 5\' 4"  (1.626 m)   Wt 131 lb (59.4 kg)   LMP 02/05/2009 (Approximate)   SpO2 99%   BMI 22.49 kg/m     General appearance: alert, cooperative and appears stated age Head: normocephalic, without obvious abnormality, atraumatic Neck: no adenopathy, supple, symmetrical, trachea midline and thyroid normal to inspection and palpation Lungs: clear to auscultation bilaterally Breasts: normal appearance, no masses or tenderness, No nipple retraction or dimpling, No nipple discharge or bleeding, No axillary adenopathy Heart: regular rate and rhythm Abdomen: soft, non-tender; no masses, no organomegaly Extremities: extremities normal, atraumatic, no cyanosis or edema Skin: skin color, texture, turgor normal. No rashes or lesions Lymph nodes: cervical, supraclavicular, and axillary nodes normal. Neurologic: grossly normal  Pelvic: External genitalia:  no lesions              No abnormal inguinal nodes palpated.              Urethra:  normal appearing urethra with no masses, tenderness or lesions              Bartholins and Skenes: normal                 Vagina: normal appearing vagina with normal color and discharge, no lesions  Cervix: no lesions              Pap taken: no. Bimanual Exam:  Uterus:  normal size, contour, position, consistency, mobility, non-tender              Adnexa: no mass, fullness, tenderness              Rectal exam: yes.  Confirms.              Anus:  normal sphincter tone, no lesions  Chaperone was present for exam:  Estill Bamberg, CMA  Assessment:   Well woman visit with gynecologic exam. Vaginal atrophy.  Osteopenia.  PCP following.   Plan: Mammogram screening discussed. Self breast awareness reviewed. Pap and HR HPV 2025 Guidelines for Calcium, Vitamin D, regular exercise program including cardiovascular and weight bearing exercise. She will try vit suppositories.  Flu vaccine.  Follow up annually and prn.   After visit summary provided.

## 2020-12-13 NOTE — Patient Instructions (Addendum)

## 2021-05-18 ENCOUNTER — Telehealth: Payer: Self-pay

## 2021-05-18 MED ORDER — ESTRADIOL 10 MCG VA TABS: ORAL_TABLET | VAGINAL | 2 refills | Status: DC

## 2021-05-18 NOTE — Telephone Encounter (Signed)
OK for Vagifem 10 mcg.  ?Sig:  place one tablet per vagina at night for 2 weeks.  Then place one tablet per vagina at night twice a week. ?Disp:  34 ?RF:  2 (refill of 24 tablets) ? ?She is due for her mammogram.  ?She will need to continue with a mammogram yearly with use of the Vagifem, which is low dose estrogen.  ?Estrogens can potentially stimulate breast cancers to grow.  ?

## 2021-05-18 NOTE — Telephone Encounter (Signed)
Encounter reviewed and closed.  

## 2021-05-18 NOTE — Telephone Encounter (Signed)
Patient was advised of Dr. Elza Rafter reply.  Patient was a little bothered by the mention of the fact that estrogen can potientially stimulate breast cancers.  She said her friend who uses it told her there was no risk with this.  She wants me to call it in with a note to pharmacy to hold the Rx until she requests them to fill it.  She wants to consider and read about it. ? ?She said she did receive letter to schedule mammo and will make a point to go on and call to schedule. ? ?Rx sent with note to hold until they hear from patient. ?

## 2021-05-18 NOTE — Telephone Encounter (Signed)
Patient called requesting Rx for vaginal dryness. Vaginal Atrophy was noted on exam in November. Patient said Dr. Quincy Simmonds had mentioned that she could use an estrogen medication to help but at the time patient declined. She saids he has a friend using Yuvafem and would like to try that or whatever Dr. Quincy Simmonds recommends. She said the vaginal dryness is becoming an issue. ?

## 2021-05-31 ENCOUNTER — Other Ambulatory Visit: Payer: Self-pay | Admitting: Obstetrics and Gynecology

## 2021-05-31 DIAGNOSIS — Z1231 Encounter for screening mammogram for malignant neoplasm of breast: Secondary | ICD-10-CM

## 2021-06-02 ENCOUNTER — Ambulatory Visit
Admission: RE | Admit: 2021-06-02 | Discharge: 2021-06-02 | Disposition: A | Discharge status: Home / Self Care | Payer: 59 | Source: Ambulatory Visit | Attending: Obstetrics and Gynecology | Admitting: Obstetrics and Gynecology

## 2021-06-02 DIAGNOSIS — Z1231 Encounter for screening mammogram for malignant neoplasm of breast: Secondary | ICD-10-CM

## 2021-06-05 ENCOUNTER — Ambulatory Visit (INDEPENDENT_AMBULATORY_CARE_PROVIDER_SITE_OTHER): Payer: 59

## 2021-06-05 ENCOUNTER — Ambulatory Visit (INDEPENDENT_AMBULATORY_CARE_PROVIDER_SITE_OTHER): Payer: 59 | Admitting: Podiatry

## 2021-06-05 ENCOUNTER — Encounter: Payer: Self-pay | Admitting: Podiatry

## 2021-06-05 DIAGNOSIS — M205X1 Other deformities of toe(s) (acquired), right foot: Secondary | ICD-10-CM

## 2021-06-05 DIAGNOSIS — M21619 Bunion of unspecified foot: Secondary | ICD-10-CM

## 2021-06-05 DIAGNOSIS — M205X9 Other deformities of toe(s) (acquired), unspecified foot: Secondary | ICD-10-CM

## 2021-06-05 DIAGNOSIS — M205X2 Other deformities of toe(s) (acquired), left foot: Secondary | ICD-10-CM

## 2021-06-06 NOTE — Progress Notes (Signed)
Subjective:  ? ?Patient ID: April Gutierrez, female   DOB: 61 y.o.   MRN: [id number]  ? ?HPI ?Patient presents with large bunion deformity left over right that makes it hard to wear shoe gear with reduced range of motion.  States that it becomes painful and a lot of her shoe she can no longer wear.  Its been going on for a number years patient does not smoke likes to be active ? ? ?Review of Systems  ?All other systems reviewed and are negative. ? ? ?   ?Objective:  ?Physical Exam ?Vitals and nursing note reviewed.  ?Constitutional:   ?   Appearance: She is well-developed.  ?Pulmonary:  ?   Effort: Pulmonary effort is normal.  ?Musculoskeletal:     ?   General: Normal range of motion.  ?Skin: ?   General: Skin is warm.  ?Neurological:  ?   Mental Status: She is alert.  ?  ?Neurovascular status found to be intact muscle strength was found to be adequate range of motion adequate.  Patient is noted to have large bone spur formation left first metatarsal with motion that adequate with no crepitus but it is limited by the bone spur.  It is red and does get painful with shoe gear.  Right foot shows mild deformity nowhere near as bad and patient has good digital perfusion well oriented x3 ? ?   ?Assessment:  ?Significant bone spur formation left first metatarsal with possibility for some joint involvement or joint arthritis ? ?   ?Plan:  ?H&P x-rays reviewed and at this point I discussed the procedure and the x-rays that show and I do think that she could just have the spur removed and this would give her good relief of the pain she gets in shoes and that hopefully she will not need fusion or implant at 1 point future.  She wants to get this done but needs to wait several months and we will look at her schedule schedule her surgery and I will see her back prior to discuss ? ?X-rays indicate large spur formation of the first metatarsal left but the joint itself looks relatively healthy and does not appear to show aggressive  arthritis ?   ? ? ?

## 2021-07-24 ENCOUNTER — Ambulatory Visit: Payer: 59 | Admitting: Podiatry

## 2021-08-21 ENCOUNTER — Encounter: Payer: 59 | Admitting: Podiatry

## 2021-09-13 ENCOUNTER — Telehealth: Payer: Self-pay | Admitting: Urology

## 2021-09-13 NOTE — Telephone Encounter (Signed)
DOS - 10/31/21  KELLER/MCBRIDE BUNIONECTOMY LEFT --- [id number]  [facility] EFFECTIVE DATE - 02/05/21  PLAN DEDUCTIBLE - $5,000.00 W/ $4,610.56 REMAINING OUT OF POCKET - $6,000.00 W/ $5,610.56 REMAINING COINSURANCE - 0% COPAY - $0.00   PER [employer] WEBSITE FOR CPT CODE 70177 HAS BEEN APPROVED, AUTH # [id number], GOOD FROM 10/10/21 - 01/08/22.

## 2021-09-18 ENCOUNTER — Ambulatory Visit: Payer: 59 | Admitting: Podiatry

## 2021-09-25 ENCOUNTER — Ambulatory Visit: Payer: 59 | Admitting: Podiatry

## 2021-09-27 ENCOUNTER — Encounter: Payer: Self-pay | Admitting: Podiatry

## 2021-09-27 ENCOUNTER — Ambulatory Visit (INDEPENDENT_AMBULATORY_CARE_PROVIDER_SITE_OTHER): Payer: 59 | Admitting: Podiatry

## 2021-09-27 DIAGNOSIS — M205X9 Other deformities of toe(s) (acquired), unspecified foot: Secondary | ICD-10-CM | POA: Diagnosis not present

## 2021-09-27 DIAGNOSIS — M21619 Bunion of unspecified foot: Secondary | ICD-10-CM | POA: Diagnosis not present

## 2021-09-28 NOTE — Progress Notes (Signed)
Subjective:   Patient ID: April Gutierrez, female   DOB: 61 y.o.   MRN: [id number]   HPI Patient presents stating she wants to have surgery done in September and states that the big toe joint has been very tender   ROS      Objective:  Physical Exam  Neurovascular status intact with large structural deformity of the dorsal and dorsal medial aspect of the first metatarsal head left with a small bone structure also within the joint itself reduced range of motion but it appears to be more due to the large bone deformity no crepitus of the joint     Assessment:  In this particular condition it appears to be more due to the large bone spur formation present first metatarsal head left with the possibility that there still may be some cartilage damage     Plan:  Reviewed condition and x-ray at great length with her.  At this point I have recommended removal of the large bone spur formation possible subchondral bone drilling and remodeling of the joint surface.  I did explain that at 1 point in future may require fusion or joint implant but I am hopeful will be able to avoid that and that this procedure will work.  I do want to immobilize it postoperatively to let it rest as I may work on the joint surface and I did dispense air fracture walker today that was fitted properly to her lower leg with all instructions given.  I also answered all questions and she read the consent form going over alternative treatments complications and signed.  Patient scheduled outpatient surgery and is encouraged to call with questions prior to procedure with total recovery.  Take 3 to 6 months

## 2021-10-16 ENCOUNTER — Encounter: Payer: 59 | Admitting: Podiatry

## 2021-10-18 ENCOUNTER — Telehealth: Payer: Self-pay

## 2021-10-18 NOTE — Telephone Encounter (Signed)
April Gutierrez called to reschedule her surgery with Dr. Paulla Dolly on 10/31/2021 due to work obligations. She has been rescheduled to 12/26/2021. Notified Dr. Paulla Dolly and Caren Griffins with [facility]

## 2021-11-06 ENCOUNTER — Encounter: Payer: 59 | Admitting: Podiatry

## 2021-11-06 ENCOUNTER — Ambulatory Visit: Payer: 59 | Admitting: Podiatry

## 2021-11-20 ENCOUNTER — Ambulatory Visit: Payer: 59 | Attending: Cardiovascular Disease | Admitting: Cardiovascular Disease

## 2021-11-20 ENCOUNTER — Encounter: Payer: Self-pay | Admitting: Cardiovascular Disease

## 2021-11-20 VITALS — BP 148/88 | HR 88 | Ht 64.0 in | Wt 135.8 lb

## 2021-11-20 DIAGNOSIS — R9431 Abnormal electrocardiogram [ECG] [EKG]: Secondary | ICD-10-CM | POA: Diagnosis not present

## 2021-11-20 DIAGNOSIS — R011 Cardiac murmur, unspecified: Secondary | ICD-10-CM

## 2021-11-20 NOTE — Progress Notes (Signed)
Chief Complaint  Patient presents with   New Patient (Initial Visit)    Abnormal EKG    History of Present Illness: 61 yo female with history of borderline HTN and skin cancer here today as a new consult, referred by Dr. Jacalyn Lefevre, for the evaluation of an abnormal EKG.  She has no prior known cardiac issues. She was being seen for a routine visit on 11/07/21 by Dr. Jacalyn Lefevre and EKG showed poor R wave progression in the precordial leads. She has no chest pain, dyspnea, dizziness, near syncope or syncope. No LE edema. She is very active. She [place] tennis [place].   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 occ     Cholecalciferol (VITAMIN D3) 1000 units CAPS Take 1 capsule by mouth daily.     Estradiol 10 MCG TABS vaginal tablet Place one tab per vagina hs x 2 weeks. Then place one tab per vaginal twice weekly thereafter. The two refills will be for quantity of #24 only. 34 tablet 2   acyclovir (ZOVIRAX) 800 MG tablet Take 800 mg by mouth 2 (two) times daily as needed.     No current facility-administered medications for this visit.    No Known Allergies  Social History   Socioeconomic History   Marital status: Married    Spouse name: Not on file   Number of children: 2   Years of education: Not on file   Highest education level: Not on file  Occupational History   Occupation: Retried Education officer, [employer]  Tobacco Use   Smoking status: Never   Smokeless tobacco: Never  Vaping Use   Vaping Use: Never used  Substance and Sexual Activity   Alcohol use: Yes    Alcohol/week: 3.0 - 5.0 standard drinks of alcohol    Types: 3 - 5 Glasses of wine per week   Drug use: No   Sexual activity: Yes     Partners: Male    Birth control/protection: Post-menopausal  Other Topics Concern   Not on file  Social History Narrative   Not on file   Social Determinants of Health   Financial Resource Strain: Not on file  Food Insecurity: Not on file  Transportation Needs: Not on file  Physical Activity: Not on file  Stress: Not on file  Social Connections: Not on file  Intimate Partner Violence: Not on file    Family History  Problem Relation Age of Onset   Osteoporosis Mother    Hypertension Father    Alzheimer's disease Father    Osteoporosis Maternal Grandmother    Breast cancer Neg Hx     Review of Systems:  As stated in the HPI and otherwise negative.   BP (!) 148/88   Pulse 88   Ht '5\' 4"'$  (1.626 m)   Wt 135 lb 12.8 oz (61.6 kg)   LMP 02/05/2009 (Approximate)   SpO2 98%   BMI 23.31 kg/m   Physical Examination: General: Well developed, well nourished, NAD  HEENT: OP clear, mucus membranes moist  SKIN: warm, dry. No rashes. Neuro: No focal deficits  Musculoskeletal: Muscle strength 5/5 all ext  Psychiatric:  Mood and affect normal  Neck: No JVD, no carotid bruits, no thyromegaly, no lymphadenopathy.  Lungs:Clear bilaterally, no wheezes, rhonci, crackles Cardiovascular: Regular rate and rhythm. Soft systolic murmur.  Abdomen:Soft. Bowel sounds present. Non-tender.  Extremities: No lower extremity edema. Pulses are 2 + in the bilateral DP/PT.  EKG:  EKG is ordered today. The ekg ordered today demonstrates NSR, anteroseptal infarct  Recent Labs: No results found for requested labs within last 365 days.   Lipid Panel    Component Value Date/Time   CHOL 266 (H) 12/04/2017 0848   TRIG 51 12/04/2017 0848   HDL 122 12/04/2017 0848   CHOLHDL 2.2 12/04/2017 0848   CHOLHDL 1.7 09/28/2015 0904   VLDL 10 09/28/2015 0904   LDLCALC 134 (H) 12/04/2017 0848     Wt Readings from Last 3 Encounters:  11/20/21 135 lb 12.8 oz (61.6 kg)  12/13/20 131 lb (59.4 kg)  12/09/19  127 lb (57.6 kg)     Assessment and Plan:   1. Cardiac murmur: Will arrange an echo to assess LVEF and exclude structural heart disease.   2. Abnormal EKG: She has poor R wave progression on her EKG. This most likely does not represent a prior MI. Echo as above to look at LV wall motion  Labs/ tests ordered today include:   Orders Placed This Encounter  Procedures   EKG 12-Lead   ECHOCARDIOGRAM COMPLETE   Disposition:   F/U with me in one year.    Signed, Lauree Chandler, MD, [facility] 11/20/2021 2:59 PM    [address], [place], [place]  [address] Phone: [phone]; Fax: [phone]

## 2021-11-20 NOTE — Patient Instructions (Signed)
Medication Instructions:  No changes *If you need a refill on your cardiac medications before your next appointment, please call your pharmacy*   Lab Work: none   Testing/Procedures: Your physician has requested that you have an echocardiogram. Echocardiography is a painless test that uses sound waves to create images of your heart. It provides your doctor with information about the size and shape of your heart and how well your heart's chambers and valves are working. This procedure takes approximately one hour. There are no restrictions for this procedure. Please do NOT wear cologne, perfume, aftershave, or lotions (deodorant is allowed). Please arrive 15 minutes prior to your appointment time.   Follow-Up: At [facility], you and your health needs are our priority.  As part of our continuing mission to provide you with exceptional heart care, we have created designated Provider Care Teams.  These Care Teams include your primary Cardiologist (physician) and Advanced Practice Providers (APPs -  Physician Assistants and Nurse Practitioners) who all work together to provide you with the care you need, when you need it.   Your next appointment:   12 month(s)  The format for your next appointment:   In Person  Provider:   Christopher McAlhany, MD   Important Information About Sugar       

## 2021-11-23 ENCOUNTER — Ambulatory Visit (HOSPITAL_COMMUNITY): Payer: 59 | Attending: Cardiology

## 2021-11-23 DIAGNOSIS — R011 Cardiac murmur, unspecified: Secondary | ICD-10-CM | POA: Diagnosis present

## 2021-11-23 LAB — ECHOCARDIOGRAM COMPLETE
Area-P 1/2: 4.31 cm2
S' Lateral: 3.1 cm

## 2021-11-27 ENCOUNTER — Telehealth: Payer: Self-pay | Admitting: *Deleted

## 2021-11-27 DIAGNOSIS — I77819 Aortic ectasia, unspecified site: Secondary | ICD-10-CM

## 2021-11-27 NOTE — Telephone Encounter (Signed)
Reviewed echo findings with the patient. She voices understanding of the aortic and mitral valve leakiness but does not want to schedule a chest CTA aorta without speaking with Dr. Angelena Form.  I explained the reasoning.   She would like for him to call her to hear more about the CT scan and why it is needed and what it entails.

## 2021-11-27 NOTE — Telephone Encounter (Signed)
-----   Message from Burnell Blanks, MD sent at 11/23/2021  3:06 PM EDT ----- Her heart is strong. She has mild leakiness of the mitral valve and mild leakiness of the aortic valve. This is why she has a murmur but nothing to worry about. We will repeat an echo in 2 years. Ascending aorta appears mildly dilated. Can we see if she is willing to have a chest CTA to define the size of her ascending aorta? Thanks, chris

## 2021-12-01 NOTE — Telephone Encounter (Signed)
Order placed for chest ct aorta and for bmet prior.  MyChart message to patient to let her know orders have been placed and she will receive a call to schedule.

## 2021-12-13 ENCOUNTER — Ambulatory Visit (HOSPITAL_COMMUNITY)
Admission: RE | Admit: 2021-12-13 | Discharge: 2021-12-13 | Disposition: A | Discharge status: Home / Self Care | Payer: 59 | Source: Ambulatory Visit | Attending: Cardiovascular Disease | Admitting: Cardiovascular Disease

## 2021-12-13 DIAGNOSIS — I77819 Aortic ectasia, unspecified site: Secondary | ICD-10-CM | POA: Diagnosis present

## 2021-12-13 MED ORDER — IOHEXOL 350 MG/ML SOLN
70.0000 mL | Freq: Once | INTRAVENOUS | Status: AC | PRN
  Administered 2021-12-13: 70 mL via INTRAVENOUS

## 2021-12-14 ENCOUNTER — Other Ambulatory Visit (HOSPITAL_COMMUNITY)
Admission: RE | Admit: 2021-12-14 | Discharge: 2021-12-14 | Disposition: A | Discharge status: Home / Self Care | Payer: 59 | Source: Ambulatory Visit | Attending: Obstetrics and Gynecology | Admitting: Obstetrics and Gynecology

## 2021-12-14 ENCOUNTER — Encounter: Payer: Self-pay | Admitting: Obstetrics and Gynecology

## 2021-12-14 ENCOUNTER — Ambulatory Visit (INDEPENDENT_AMBULATORY_CARE_PROVIDER_SITE_OTHER): Payer: 59 | Admitting: Obstetrics and Gynecology

## 2021-12-14 VITALS — BP 130/80 | HR 78 | Ht 64.0 in | Wt 130.0 lb

## 2021-12-14 DIAGNOSIS — Z124 Encounter for screening for malignant neoplasm of cervix: Secondary | ICD-10-CM | POA: Diagnosis not present

## 2021-12-14 DIAGNOSIS — Z01419 Encounter for gynecological examination (general) (routine) without abnormal findings: Secondary | ICD-10-CM

## 2021-12-14 DIAGNOSIS — Z23 Encounter for immunization: Secondary | ICD-10-CM | POA: Diagnosis not present

## 2021-12-14 MED ORDER — ESTRADIOL 10 MCG VA TABS: ORAL_TABLET | VAGINAL | 11 refills | Status: AC

## 2021-12-14 NOTE — Progress Notes (Signed)
61 y.o. G30P2011 Married Caucasian female here for annual exam.    Using Vagifem.  Needs refill.   Saw cardiology due to elevated blood pressure and heart murmur.  Had EKG, ECHO, CT angio of chest.   PCP:   Cristie Hem, MD  Patient's last menstrual period was 02/05/2009 (approximate).           Sexually active: ye.  The current method of family planning is postmenopausal female. Exercising: Yes.    Tennis, walking and personal trainer  Smoker:  no  Health Maintenance: Pap:  11-19-16 Neg:Neg HR HPV, 08-13-13 Neg:Neg HR HPV  History of abnormal Pap:  no MMG:  06/02/2021 BI-RADS CATEGORY  1: Negative.  Colonoscopy:  04/08/2020 - polyp - due in 7 - 10 years. BMD:  10/26/2020 osteopenia TDaP: 2013  Gardasil:   no HIV:  2017 - NR Hep C:  2017 - Neg Screening Labs: PCP   reports that she has never smoked. She has never used smokeless tobacco. She reports current alcohol use of about 3.0 - 5.0 standard drinks of alcohol per week. She reports that she does not use drugs.  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     CALCIUM PO Take by mouth. occ     Cholecalciferol (VITAMIN D3) 1000 units CAPS Take 1 capsule by mouth daily.     Estradiol 10 MCG TABS vaginal tablet Place one tab per vagina hs x 2 weeks. Then place one tab per vaginal twice weekly thereafter. The two refills will be for quantity of #24 only. 34 tablet 2   No current facility-administered medications for this visit.    Family History  Problem Relation Age of Onset   Osteoporosis Mother    Hypertension Father    Alzheimer's disease Father    Osteoporosis Maternal Grandmother    Breast cancer Neg Hx     Review of Systems   All other systems reviewed and are negative.   Exam:   BP 130/80 (BP Location: Right Arm, Patient Position: Sitting, Cuff Size: Normal)   Pulse 78   Ht '5\' 4"'$  (1.626 m)   Wt 130 lb (59 kg)   LMP 02/05/2009 (Approximate)   BMI 22.31 kg/m     General appearance: alert, cooperative and appears stated age Head: normocephalic, without obvious abnormality, atraumatic Neck: no adenopathy, supple, symmetrical, trachea midline and thyroid normal to inspection and palpation Lungs: clear to auscultation bilaterally Breasts: normal appearance, no masses or tenderness, No nipple retraction or dimpling, No nipple discharge or bleeding, No axillary adenopathy Heart: regular rate and rhythm Abdomen: soft, non-tender; no masses, no organomegaly Extremities: extremities normal, atraumatic, no cyanosis or edema Skin: skin color, texture, turgor normal. No rashes or lesions Lymph nodes: cervical, supraclavicular, and axillary nodes normal. Neurologic: grossly normal  Pelvic: External genitalia:  no lesions              No abnormal inguinal nodes palpated.              Urethra:  normal appearing urethra with no masses, tenderness or lesions  Bartholins and Skenes: normal                 Vagina: normal appearing vagina with normal color and discharge, no lesions              Cervix: no lesions              Pap taken: yes Bimanual Exam:  Uterus:  normal size, contour, position, consistency, mobility, non-tender              Adnexa: no mass, fullness, tenderness              Rectal exam: yes.  Confirms.              Anus:  normal sphincter tone, no lesions  Chaperone was present for exam:  Kimalexis  Assessment:   Well woman visit with gynecologic exam. Osteopenia.  Vaginal atrophy.   Plan: Mammogram screening discussed. Self breast awareness reviewed. Pap and HR HPV collected. Guidelines for Calcium, Vitamin D, regular exercise program including cardiovascular and weight bearing  exercise. BMD in 2024.  Rx for Vagifem.  TDap. Labs with PCP.  Follow up annually and prn.   After visit summary provided.

## 2021-12-14 NOTE — Patient Instructions (Signed)

## 2021-12-20 LAB — CYTOLOGY - PAP
Comment: NEGATIVE
Diagnosis: NEGATIVE
High risk HPV: NEGATIVE

## 2021-12-25 MED ORDER — HYDROCODONE-ACETAMINOPHEN 10-325 MG PO TABS: 1.0000 | ORAL_TABLET | Freq: Three times a day (TID) | ORAL | 0 refills | Status: AC | PRN

## 2021-12-25 NOTE — Addendum Note (Signed)
Addended by: Wallene Huh on: 12/25/2021 01:27 PM   Modules accepted: Orders

## 2021-12-26 DIAGNOSIS — M2012 Hallux valgus (acquired), left foot: Secondary | ICD-10-CM | POA: Diagnosis not present

## 2021-12-27 ENCOUNTER — Telehealth: Payer: Self-pay | Admitting: *Deleted

## 2021-12-27 ENCOUNTER — Telehealth: Payer: Self-pay | Admitting: Cardiovascular Disease

## 2021-12-27 NOTE — Telephone Encounter (Signed)
Patient is calling for clarification on when she is supposed to wear her boot, surgical center is saying that she has to wear all the times even while asleep but she thought the doctor told her after surgery that she did not have to wear it at all times. Please advise.

## 2021-12-27 NOTE — Telephone Encounter (Signed)
April Blanks, MD 12/14/2021 10:26 AM EST     Her aorta is not enlarged. Great news. cdm   The patient has been notified of the result and verbalized understanding.  Patient also had questions about an EKG she had done at the surgical center yesterday. She states that the anesthesiologist told her that her EKG showed that she had a previous heart attack and she had not been told before so wanted to double check with her cardiologist. Advised patient on information from Dr. Camillia Herter last office note:  2. Abnormal EKG: She has poor R wave progression on her EKG. This most likely does not represent a prior MI. Echo as above to look at LV wall motion

## 2021-12-27 NOTE — Telephone Encounter (Signed)
Pt would like a callback regarding recent test results. Please advise

## 2022-01-01 ENCOUNTER — Encounter: Payer: 59 | Admitting: Podiatry

## 2022-01-01 ENCOUNTER — Ambulatory Visit (INDEPENDENT_AMBULATORY_CARE_PROVIDER_SITE_OTHER): Payer: 59

## 2022-01-01 DIAGNOSIS — M21619 Bunion of unspecified foot: Secondary | ICD-10-CM

## 2022-01-01 DIAGNOSIS — M21612 Bunion of left foot: Secondary | ICD-10-CM

## 2022-01-01 DIAGNOSIS — M205X9 Other deformities of toe(s) (acquired), unspecified foot: Secondary | ICD-10-CM

## 2022-01-01 NOTE — Progress Notes (Signed)
Patient in office for POV #1 DOS 12/26/2021 KELLER/MCBRIDE BUNIONECTOMY LT   Patient denies nausea, vomiting, fever and chills. Pain is well controlled at this time. Patient has good ROM in the left hallux. Pulse is strong in the left foot as well. Patient denies calf pain. X-Ray of the left foot obtained and will be reviewed by provider. Compression ACE wrap appiled to left foot. Encouraged patient to continue elevating the foot and using ice. Advised patient to continue wearing air fracture walker as directed. Will have patient back in 10 days for x-rays and transition into a surgical shoe at that time.  Advised patient to call the office with any questions, comments, or concerns. Patient verbalized understanding. All questions answered during this visit.

## 2022-01-11 ENCOUNTER — Ambulatory Visit (INDEPENDENT_AMBULATORY_CARE_PROVIDER_SITE_OTHER): Payer: 59

## 2022-01-11 VITALS — BP 154/96 | HR 73

## 2022-01-11 DIAGNOSIS — M21612 Bunion of left foot: Secondary | ICD-10-CM | POA: Diagnosis not present

## 2022-01-11 DIAGNOSIS — M21619 Bunion of unspecified foot: Secondary | ICD-10-CM

## 2022-01-11 NOTE — Progress Notes (Signed)
Patient presents today for post op visit # 2, patient of Dr.Regal.   POV # 2 DOS 12/26/21 Keller/McBride Bunionectomy Lt    She presents in her walking boot and was transitioned to a surgical shoe today. Denies any falls or injury to the foot. Foot is swollen. No signs of infection. No calf pain or shortness of breath. Bandages dry and intact. Incision is intact. She will began to work her way back into a supportive soft shoe over the next few days. Patient has good ROM in the left hallux.    BP: 154/96 (Patient states PCP is monitoring BP at this time, Denies nasuea, vomiting, blurred vision and headache.) P: 73    Xrays taken today and reviewed by Dr. Paulla Dolly.   Foot redressed today and placed her back in the surgical shoe. Reviewed icing and elevation. No further follow-ups needed at this time.

## 2022-01-15 ENCOUNTER — Other Ambulatory Visit: Payer: 59

## 2022-05-29 ENCOUNTER — Other Ambulatory Visit: Payer: Self-pay | Admitting: Obstetrics and Gynecology

## 2022-05-29 DIAGNOSIS — Z1231 Encounter for screening mammogram for malignant neoplasm of breast: Secondary | ICD-10-CM

## 2022-06-05 ENCOUNTER — Ambulatory Visit
Admission: RE | Admit: 2022-06-05 | Discharge: 2022-06-05 | Disposition: A | Discharge status: Home / Self Care | Payer: 59 | Source: Ambulatory Visit | Attending: Obstetrics and Gynecology | Admitting: Obstetrics and Gynecology

## 2022-06-05 DIAGNOSIS — Z1231 Encounter for screening mammogram for malignant neoplasm of breast: Secondary | ICD-10-CM

## 2023-01-01 ENCOUNTER — Other Ambulatory Visit: Payer: Self-pay | Admitting: Obstetrics and Gynecology

## 2023-01-01 DIAGNOSIS — Z1231 Encounter for screening mammogram for malignant neoplasm of breast: Secondary | ICD-10-CM

## 2023-06-10 ENCOUNTER — Ambulatory Visit: Payer: 59

## 2023-06-10 ENCOUNTER — Ambulatory Visit
Admission: RE | Admit: 2023-06-10 | Discharge: 2023-06-10 | Disposition: A | Discharge status: Home / Self Care | Source: Ambulatory Visit | Attending: Obstetrics and Gynecology | Admitting: Obstetrics and Gynecology

## 2023-06-10 DIAGNOSIS — Z1231 Encounter for screening mammogram for malignant neoplasm of breast: Secondary | ICD-10-CM

## 2023-06-13 ENCOUNTER — Encounter: Payer: Self-pay | Admitting: Obstetrics and Gynecology

## 2023-08-31 IMAGING — MG MM DIGITAL SCREENING BILAT W/ TOMO AND CAD
8 series · 9 of 24 positions shown · non-contrast
Comparison: Previous exam(s).

CLINICAL DATA: Screening.

EXAM:
DIGITAL SCREENING BILATERAL MAMMOGRAM WITH TOMOSYNTHESIS AND CAD
TECHNIQUE: Bilateral screening digital craniocaudal and mediolateral oblique
mammograms were obtained. Bilateral screening digital breast
tomosynthesis was performed. The images were evaluated with
computer-aided detection.

[R CC synth-2D]
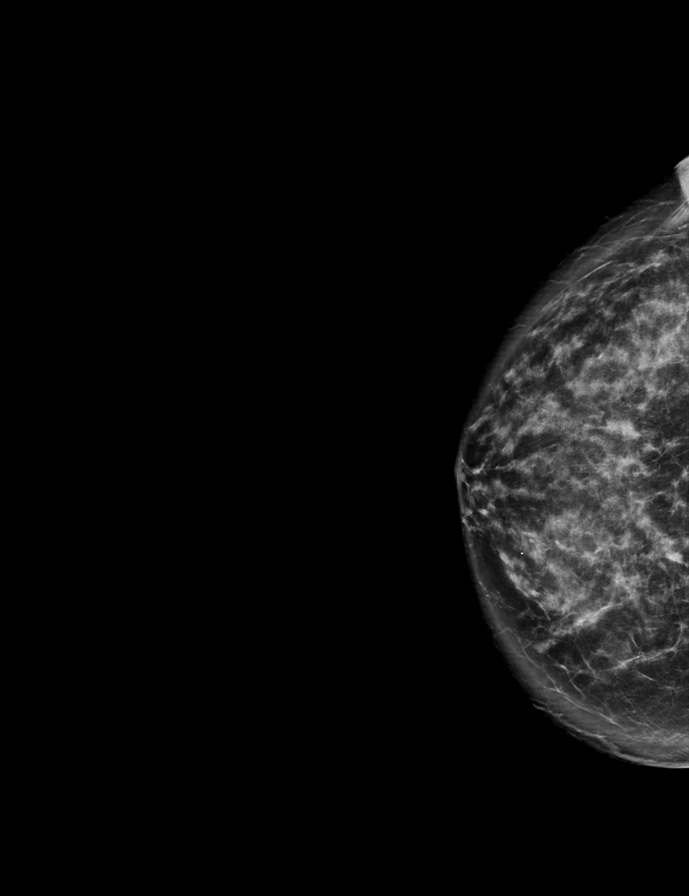

[R MLO synth-2D]
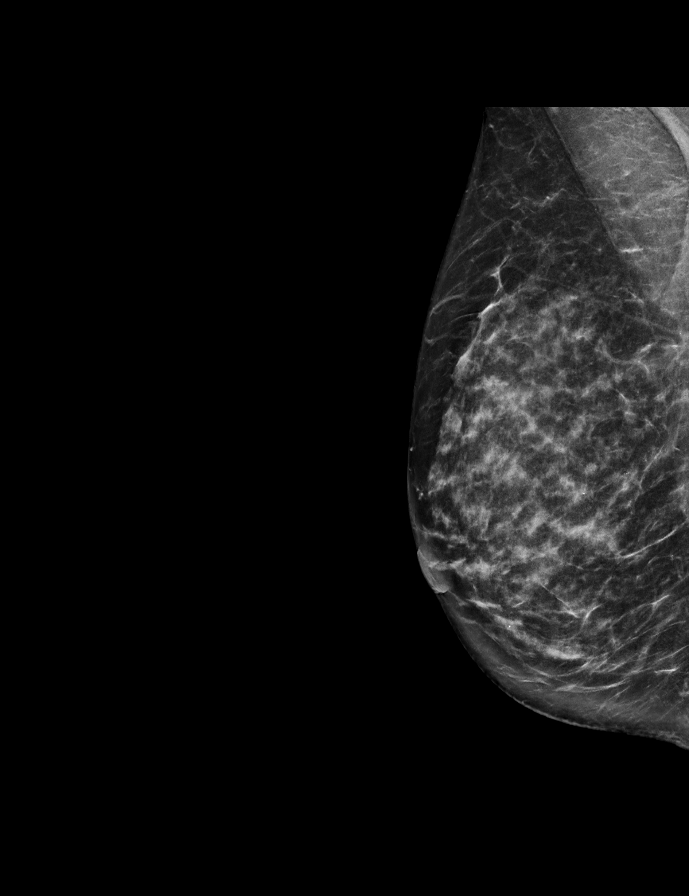

[L MLO synth-2D]
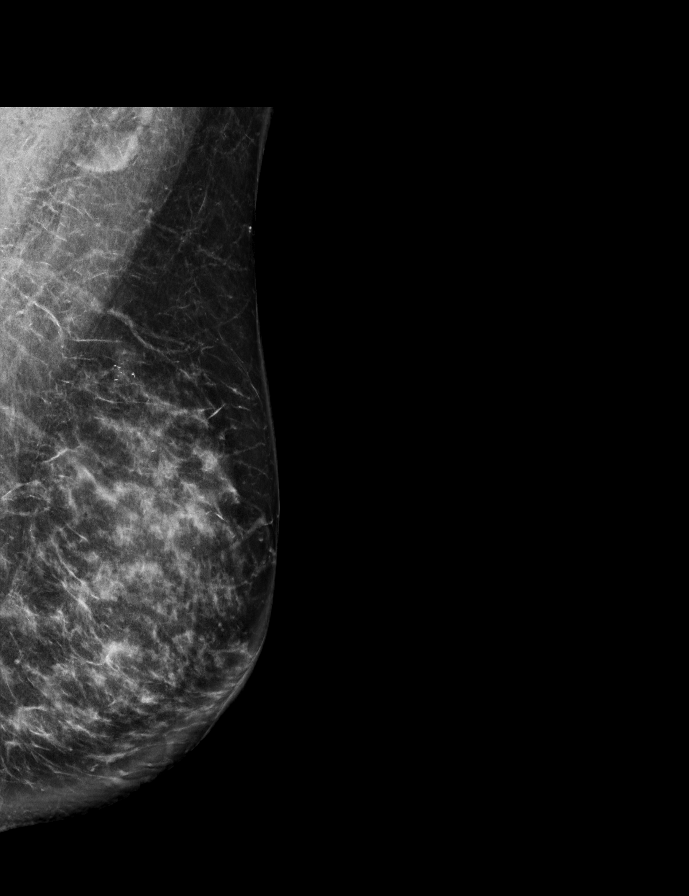

[L CC synth-2D]
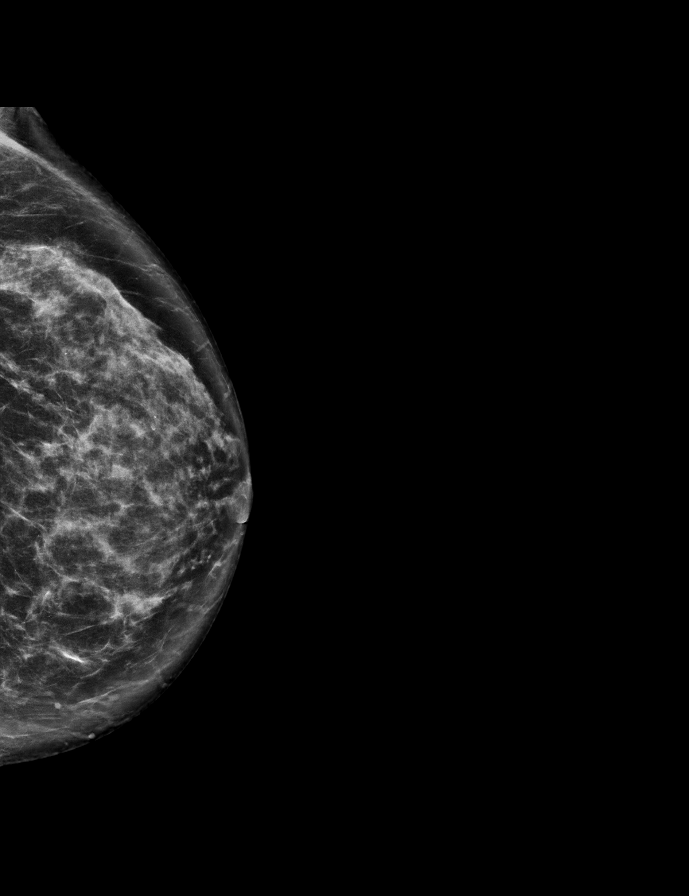

[R CC tomo · 2 of 77 frames shown]
[frame 25/77]
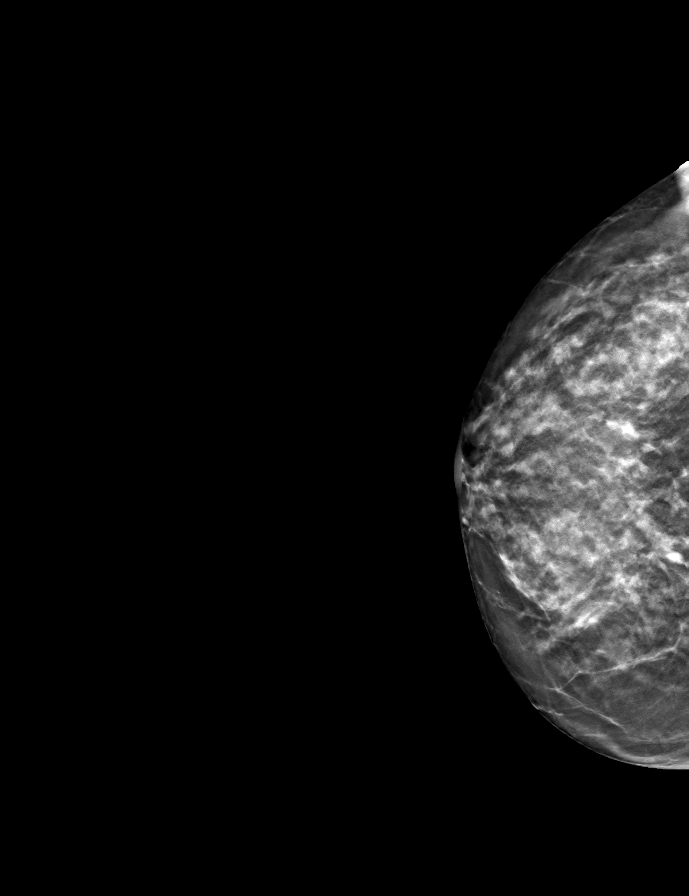
[frame 39/77]
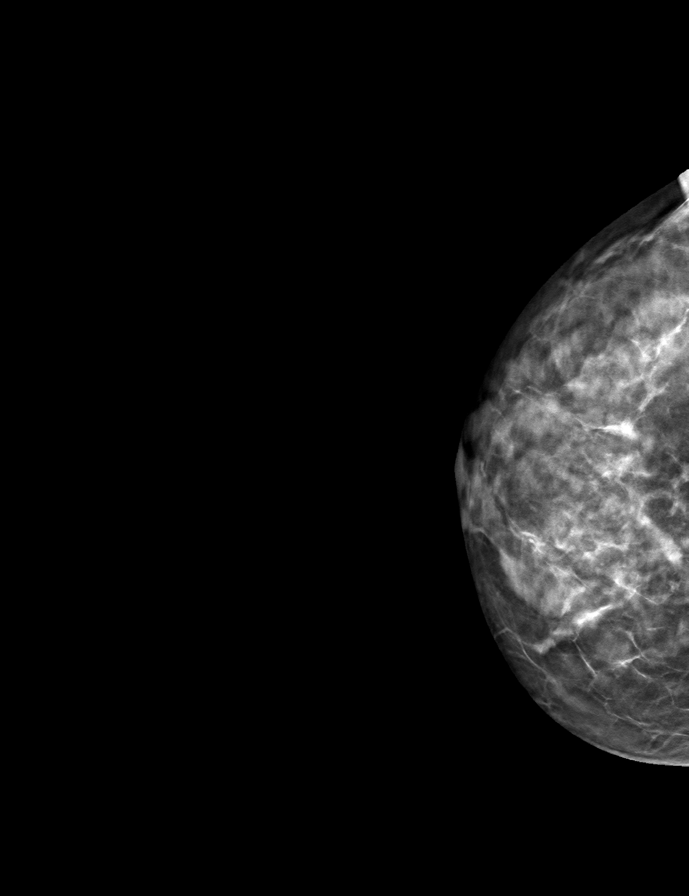

[L MLO tomo · tomo slice 37/73.0]
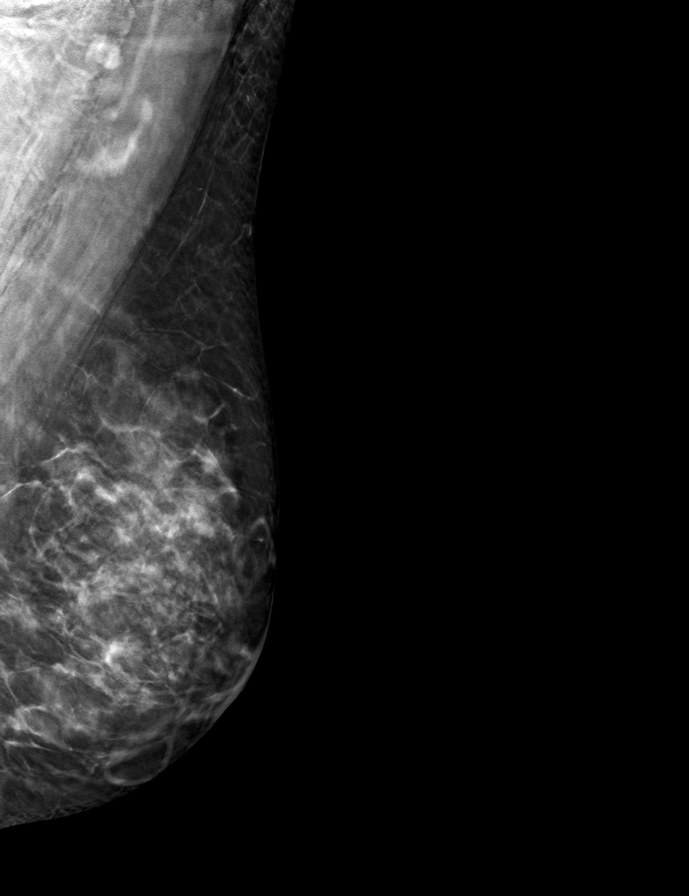

[R MLO tomo · tomo slice 33/66.0]
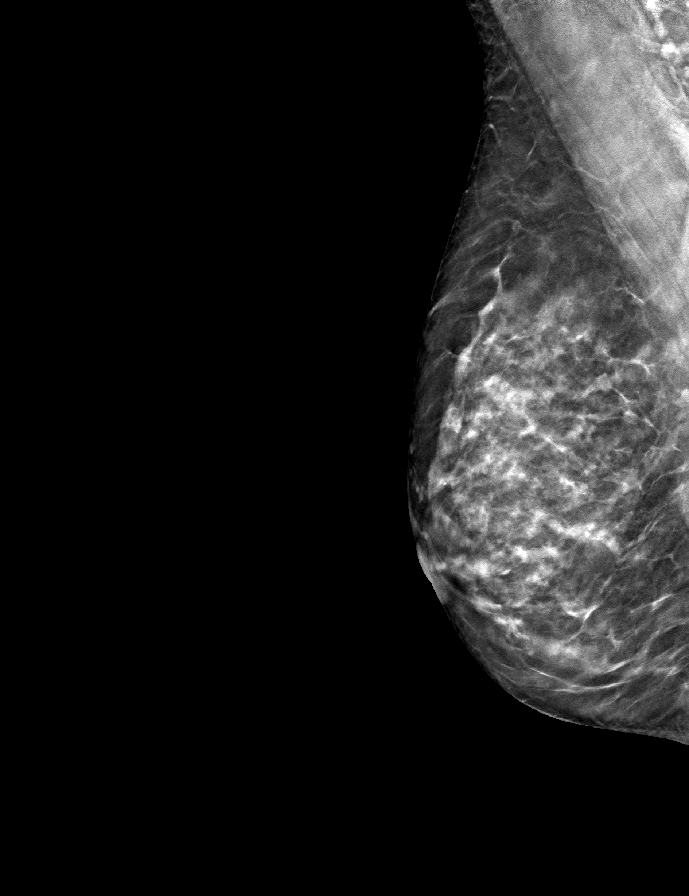

[L CC tomo · tomo slice 39/76.0]
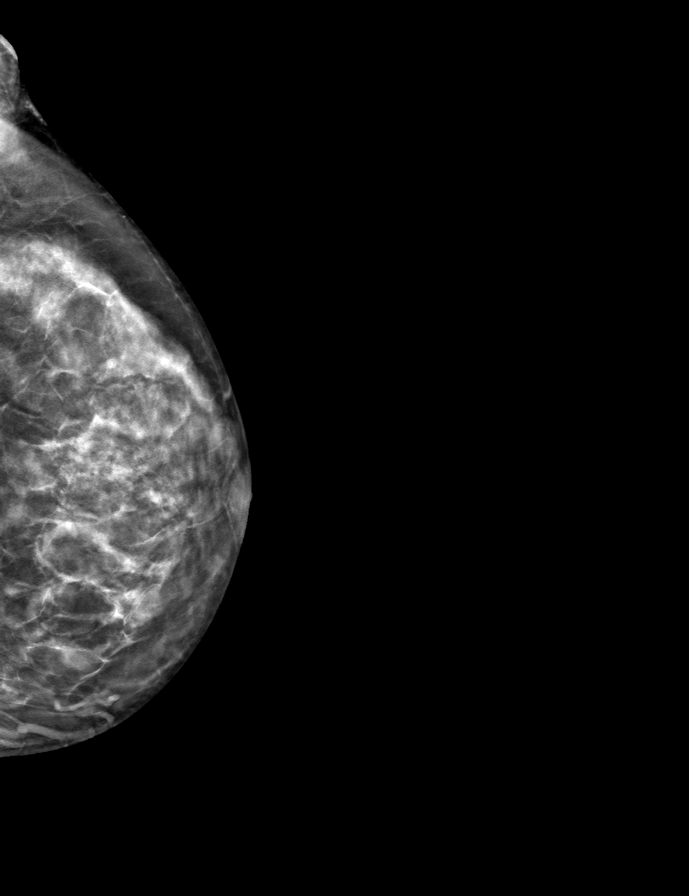

[9 of 24 positions shown; findings below may reference images not displayed]

ACR Breast Density Category c: The breast tissue is heterogeneously
dense, which may obscure small masses.
FINDINGS: There are no findings suspicious for malignancy.
IMPRESSION: No mammographic evidence of malignancy. A result letter of this
screening mammogram will be mailed directly to the patient.

RECOMMENDATION:
Screening mammogram in one year. (Code:Q3-W-BC3)

BI-RADS CATEGORY  1: Negative.
